# Patient Record
Sex: Female | Born: 1950 | Race: White | Hispanic: No | Smoking: Former smoker
Health system: Southern US, Community
[De-identification: ages and names within clinical notes are randomized; demographics above are authoritative.]

## PROBLEM LIST (undated history)

## (undated) DIAGNOSIS — E7800 Pure hypercholesterolemia, unspecified: Secondary | ICD-10-CM

## (undated) DIAGNOSIS — N903 Dysplasia of vulva, unspecified: Secondary | ICD-10-CM

## (undated) DIAGNOSIS — N3281 Overactive bladder: Secondary | ICD-10-CM

## (undated) DIAGNOSIS — D369 Benign neoplasm, unspecified site: Secondary | ICD-10-CM

## (undated) DIAGNOSIS — R0602 Shortness of breath: Secondary | ICD-10-CM

## (undated) DIAGNOSIS — R011 Cardiac murmur, unspecified: Secondary | ICD-10-CM

## (undated) DIAGNOSIS — M1990 Unspecified osteoarthritis, unspecified site: Secondary | ICD-10-CM

## (undated) DIAGNOSIS — K219 Gastro-esophageal reflux disease without esophagitis: Secondary | ICD-10-CM

## (undated) DIAGNOSIS — R112 Nausea with vomiting, unspecified: Secondary | ICD-10-CM

## (undated) DIAGNOSIS — Z780 Asymptomatic menopausal state: Secondary | ICD-10-CM

## (undated) DIAGNOSIS — G4730 Sleep apnea, unspecified: Secondary | ICD-10-CM

## (undated) DIAGNOSIS — F329 Major depressive disorder, single episode, unspecified: Secondary | ICD-10-CM

## (undated) DIAGNOSIS — K760 Fatty (change of) liver, not elsewhere classified: Secondary | ICD-10-CM

## (undated) DIAGNOSIS — E039 Hypothyroidism, unspecified: Secondary | ICD-10-CM

## (undated) DIAGNOSIS — K5792 Diverticulitis of intestine, part unspecified, without perforation or abscess without bleeding: Secondary | ICD-10-CM

## (undated) DIAGNOSIS — Z9889 Other specified postprocedural states: Secondary | ICD-10-CM

## (undated) DIAGNOSIS — E0500 Thyrotoxicosis with diffuse goiter without thyrotoxic crisis or storm: Secondary | ICD-10-CM

## (undated) DIAGNOSIS — F32A Depression, unspecified: Secondary | ICD-10-CM

## (undated) DIAGNOSIS — J449 Chronic obstructive pulmonary disease, unspecified: Secondary | ICD-10-CM

## (undated) DIAGNOSIS — D1803 Hemangioma of intra-abdominal structures: Secondary | ICD-10-CM

## (undated) DIAGNOSIS — G5600 Carpal tunnel syndrome, unspecified upper limb: Secondary | ICD-10-CM

## (undated) HISTORY — DX: Fatty (change of) liver, not elsewhere classified: K76.0

## (undated) HISTORY — DX: Major depressive disorder, single episode, unspecified: F32.9

## (undated) HISTORY — DX: Overactive bladder: N32.81

## (undated) HISTORY — DX: Gastro-esophageal reflux disease without esophagitis: K21.9

## (undated) HISTORY — DX: Hemangioma of intra-abdominal structures: D18.03

## (undated) HISTORY — DX: Carpal tunnel syndrome, unspecified upper limb: G56.00

## (undated) HISTORY — PX: COLONOSCOPY W/ BIOPSIES AND POLYPECTOMY: SHX1376

## (undated) HISTORY — DX: Dysplasia of vulva, unspecified: N90.3

## (undated) HISTORY — DX: Benign neoplasm, unspecified site: D36.9

## (undated) HISTORY — DX: Hypothyroidism, unspecified: E03.9

## (undated) HISTORY — PX: TUBAL LIGATION: SHX77

## (undated) HISTORY — DX: Pure hypercholesterolemia, unspecified: E78.00

## (undated) HISTORY — DX: Depression, unspecified: F32.A

## (undated) HISTORY — DX: Thyrotoxicosis with diffuse goiter without thyrotoxic crisis or storm: E05.00

## (undated) HISTORY — DX: Asymptomatic menopausal state: Z78.0

## (undated) HISTORY — PX: CARPAL TUNNEL RELEASE: SHX101

---

## 1979-08-25 HISTORY — PX: BREAST REDUCTION SURGERY: SHX8

## 1983-08-25 HISTORY — PX: TONSILLECTOMY: SUR1361

## 1996-08-24 HISTORY — PX: VESICOVAGINAL FISTULA CLOSURE W/ TAH: SUR271

## 1996-08-24 HISTORY — PX: ABDOMINAL HYSTERECTOMY: SHX81

## 1999-01-27 ENCOUNTER — Ambulatory Visit (HOSPITAL_COMMUNITY): Admission: RE | Admit: 1999-01-27 | Discharge: 1999-01-27 | Payer: Self-pay | Admitting: Family Medicine

## 1999-01-28 ENCOUNTER — Encounter: Payer: Self-pay | Admitting: Family Medicine

## 2000-09-27 ENCOUNTER — Other Ambulatory Visit: Admission: RE | Admit: 2000-09-27 | Discharge: 2000-09-27 | Payer: Self-pay | Admitting: Obstetrics and Gynecology

## 2003-03-04 ENCOUNTER — Encounter: Payer: Self-pay | Admitting: Emergency Medicine

## 2003-03-04 ENCOUNTER — Emergency Department (HOSPITAL_COMMUNITY): Admission: EM | Admit: 2003-03-04 | Discharge: 2003-03-04 | Payer: Self-pay | Admitting: Emergency Medicine

## 2004-01-24 ENCOUNTER — Other Ambulatory Visit: Admission: RE | Admit: 2004-01-24 | Discharge: 2004-01-24 | Payer: Self-pay | Admitting: Obstetrics and Gynecology

## 2004-04-14 ENCOUNTER — Encounter: Admission: RE | Admit: 2004-04-14 | Discharge: 2004-04-14 | Payer: Self-pay | Admitting: Obstetrics and Gynecology

## 2004-08-26 ENCOUNTER — Encounter: Admission: RE | Admit: 2004-08-26 | Discharge: 2004-08-26 | Payer: Self-pay | Admitting: Internal Medicine

## 2004-12-08 ENCOUNTER — Encounter: Admission: RE | Admit: 2004-12-08 | Discharge: 2004-12-08 | Payer: Self-pay | Admitting: *Deleted

## 2004-12-12 ENCOUNTER — Encounter (INDEPENDENT_AMBULATORY_CARE_PROVIDER_SITE_OTHER): Payer: Self-pay | Admitting: Specialist

## 2004-12-12 ENCOUNTER — Ambulatory Visit (HOSPITAL_COMMUNITY): Admission: RE | Admit: 2004-12-12 | Discharge: 2004-12-12 | Payer: Self-pay | Admitting: *Deleted

## 2004-12-12 ENCOUNTER — Ambulatory Visit (HOSPITAL_BASED_OUTPATIENT_CLINIC_OR_DEPARTMENT_OTHER): Admission: RE | Admit: 2004-12-12 | Discharge: 2004-12-12 | Payer: Self-pay | Admitting: *Deleted

## 2005-04-15 ENCOUNTER — Encounter (INDEPENDENT_AMBULATORY_CARE_PROVIDER_SITE_OTHER): Payer: Self-pay | Admitting: Specialist

## 2005-04-15 ENCOUNTER — Ambulatory Visit (HOSPITAL_COMMUNITY): Admission: RE | Admit: 2005-04-15 | Discharge: 2005-04-15 | Payer: Self-pay | Admitting: Gastroenterology

## 2005-05-20 ENCOUNTER — Encounter: Admission: RE | Admit: 2005-05-20 | Discharge: 2005-05-20 | Payer: Self-pay | Admitting: Obstetrics and Gynecology

## 2005-08-03 ENCOUNTER — Encounter: Admission: RE | Admit: 2005-08-03 | Discharge: 2005-08-03 | Payer: Self-pay | Admitting: Internal Medicine

## 2006-03-06 ENCOUNTER — Emergency Department (HOSPITAL_COMMUNITY): Admission: EM | Admit: 2006-03-06 | Discharge: 2006-03-06 | Payer: Self-pay | Admitting: Emergency Medicine

## 2006-09-22 IMAGING — US US ABDOMEN COMPLETE
1 series · 14 of 25 positions shown · non-contrast
Comparison: 08/26/04.

CLINICAL DATA: Elevated LFTs.
 ABDOMEN ULTRASOUND:
TECHNIQUE: Complete abdominal ultrasound examination was performed including evaluation of the liver, gallbladder, bile ducts, pancreas, kidneys, spleen, IVC, and abdominal aorta.

[Series 1: unknown · 14 of 63 slices shown]
[im 1/63]
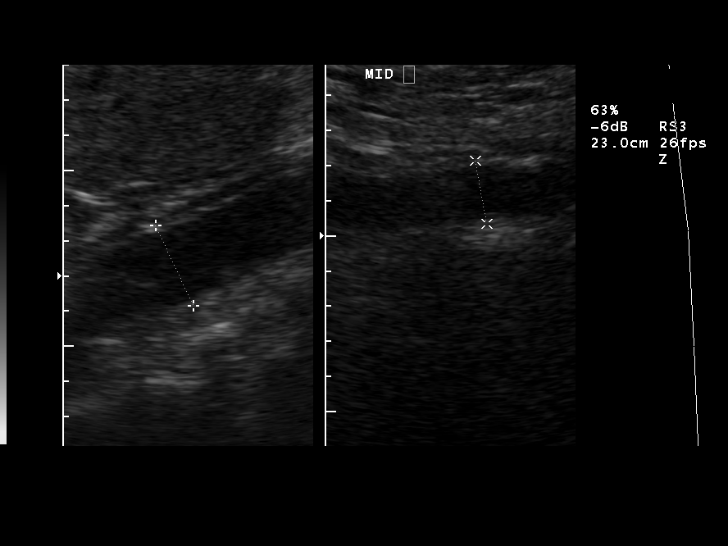
[im 6/63]
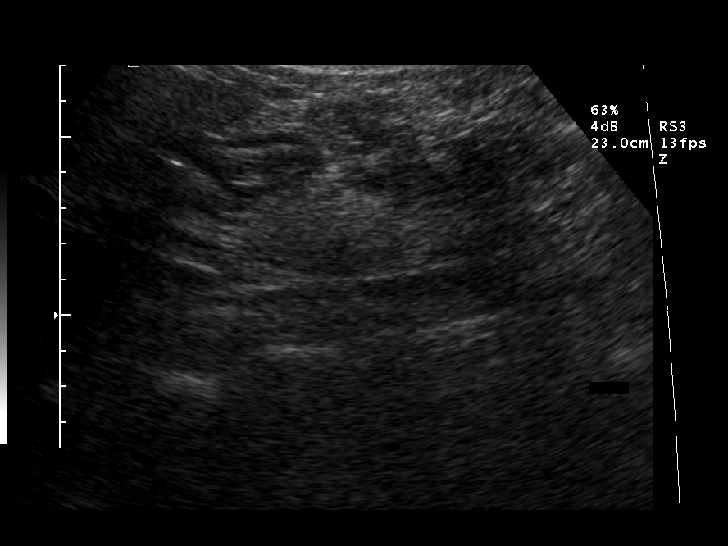
[im 11/63]
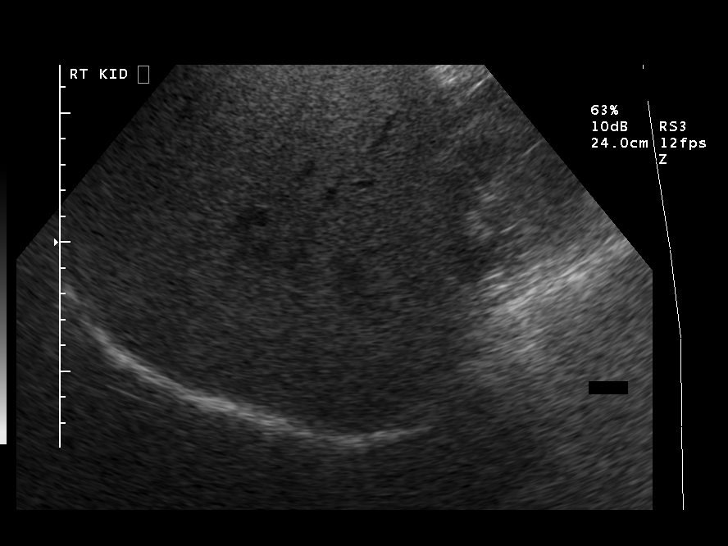
[im 16/63]
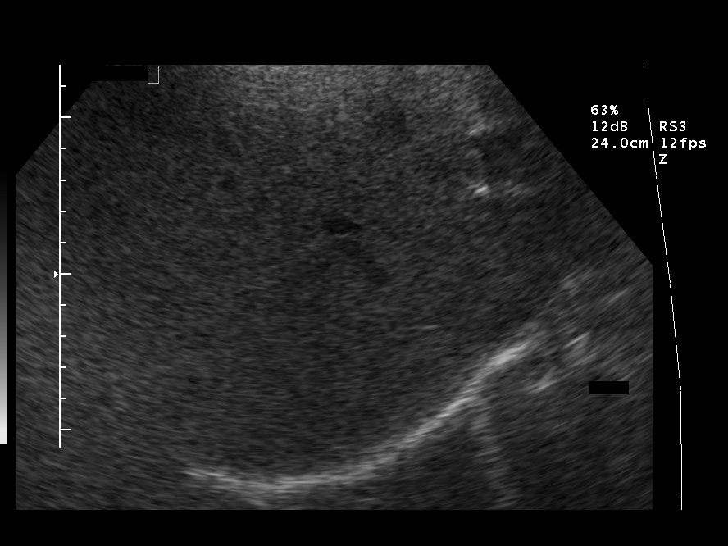
[im 21/63]
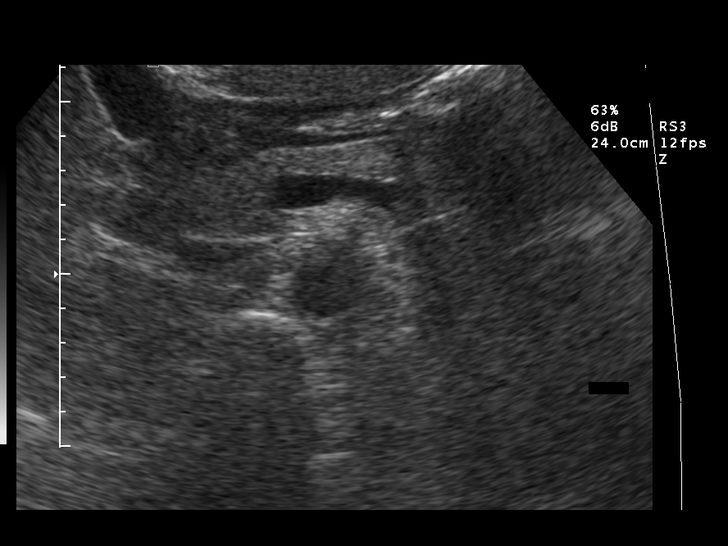
[im 24/63]
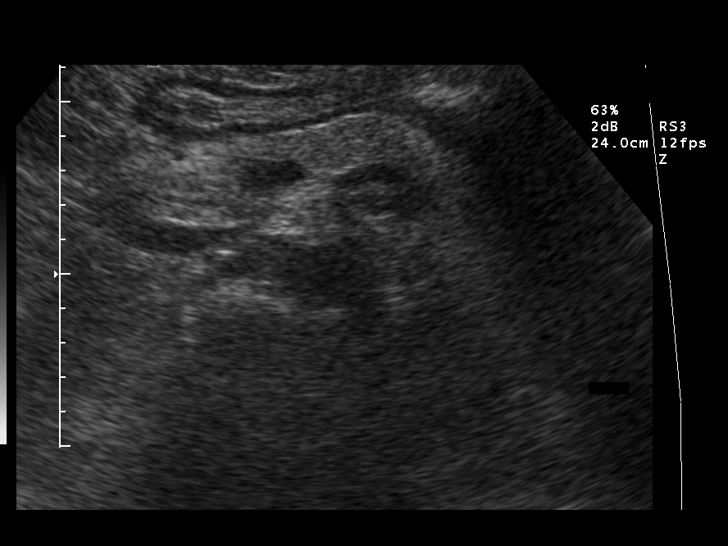
[im 29/63]
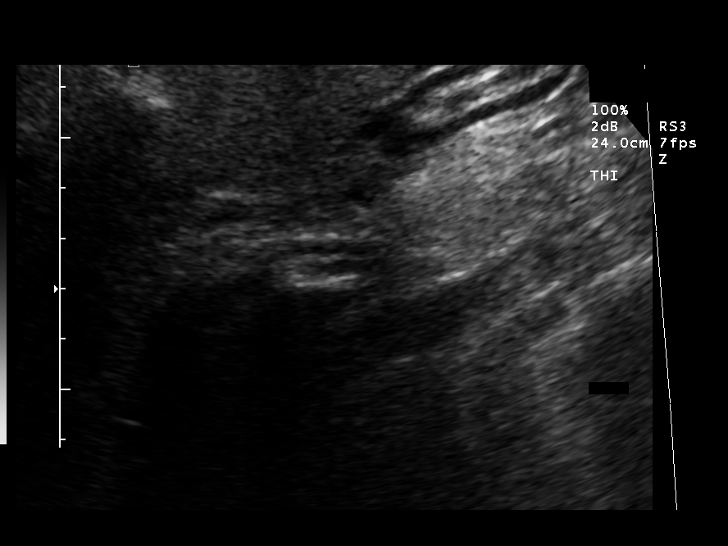
[im 34/63]
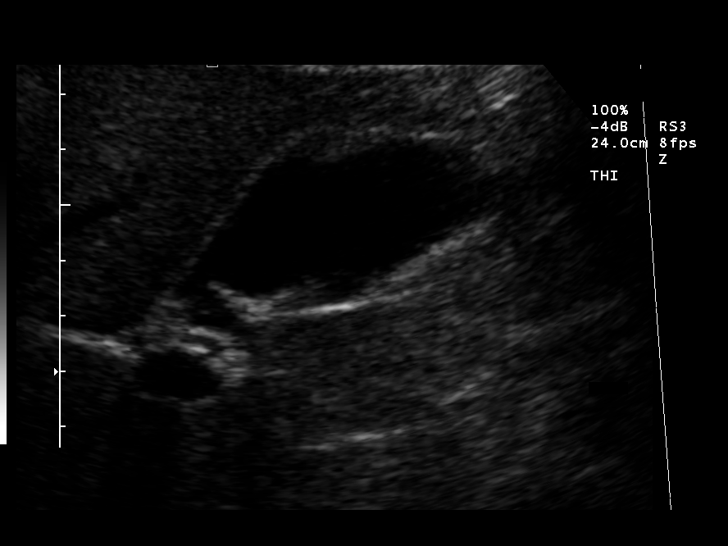
[im 39/63]
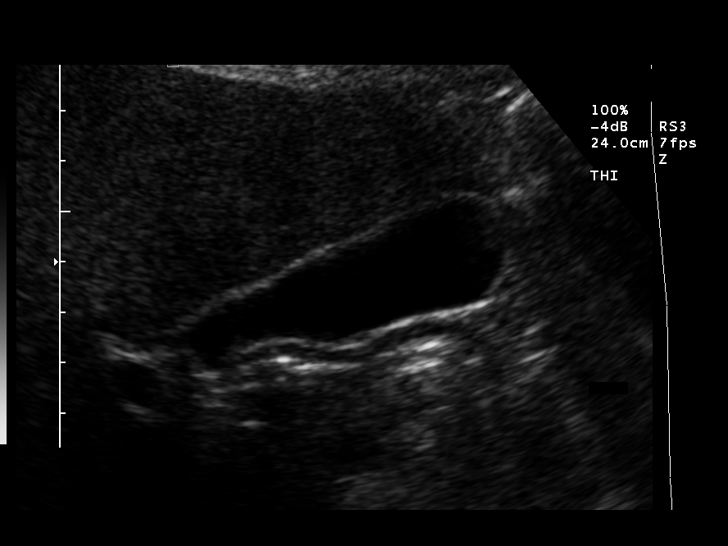
[im 42/63]
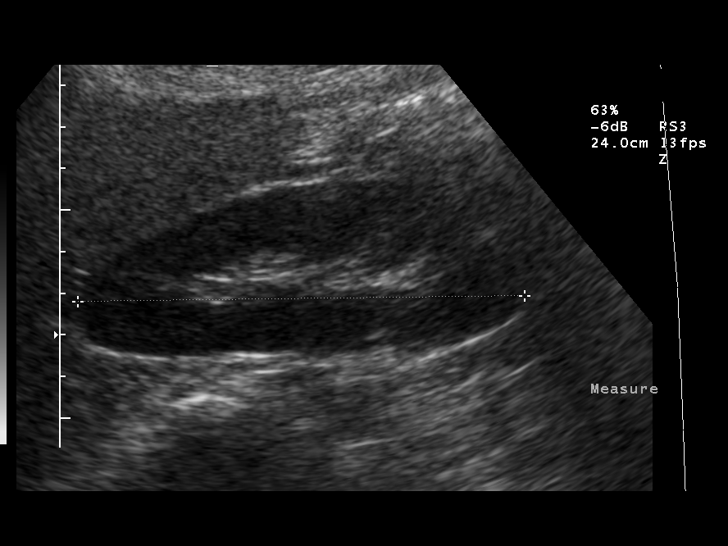
[im 47/63]
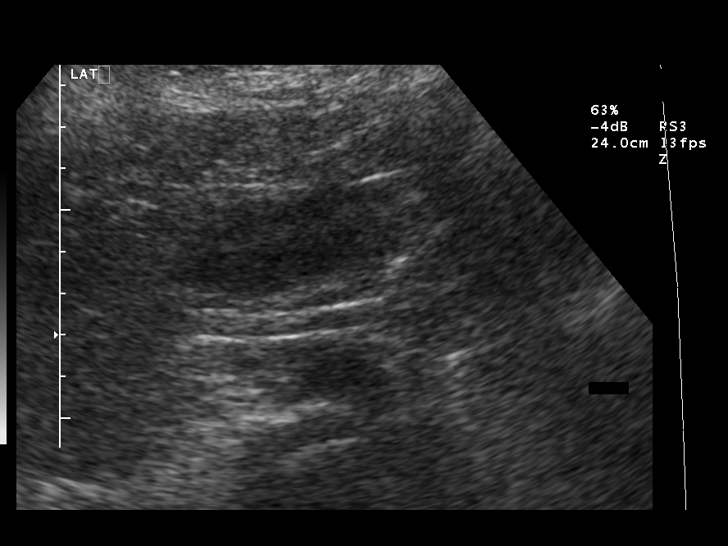
[im 52/63]
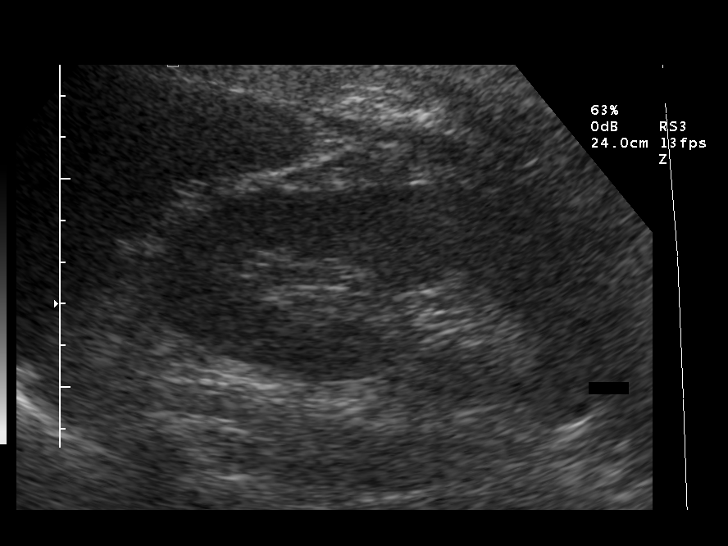
[im 57/63]
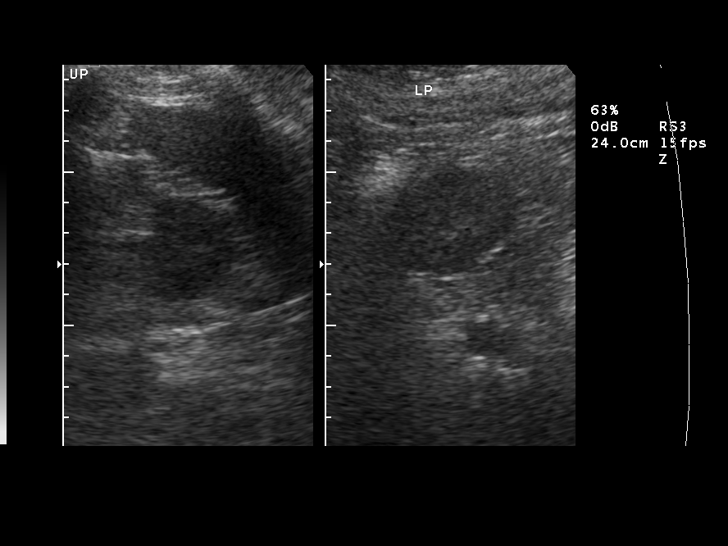
[im 63/63]
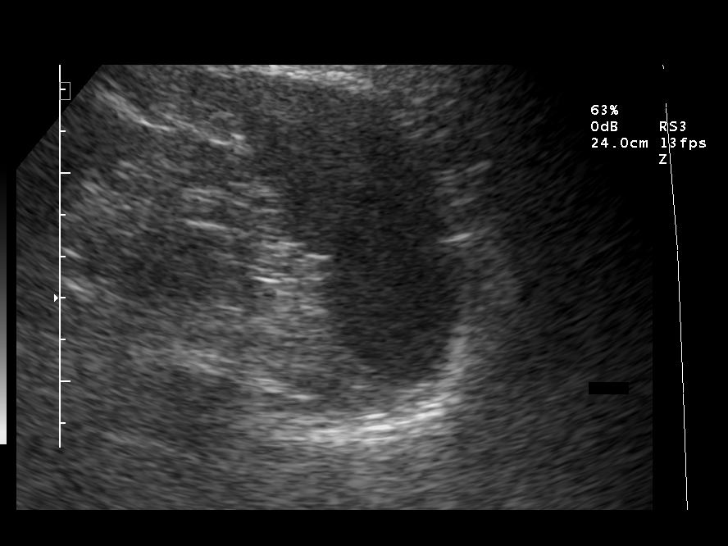

[14 of 25 positions shown; findings below may reference images not displayed]

On the prior study, small gallbladder polyps were questioned.  However, no gallbladder polyps are evident, and no gallstones are seen on the current exam.  The liver is echogenic consistent with fatty infiltration. The common bile duct is normal measuring between 2.6 and 3.4 mm in diameter.  Assessment of the IVC and pancreas is limited by bowel gas with the tail of the pancreas obscured.  The spleen is normal in size.  No hydronephrosis is noted.  Both kidneys measure 11.0 cm sagittally.  The abdominal aorta is normal in caliber.
IMPRESSION: 1.  No gallstones.  No definite gallbladder polyps are noted on the current study. 
 2.  Fatty infiltration of the liver.
 3.  Tail of pancreas obscured by bowel gas.

## 2007-02-07 ENCOUNTER — Encounter: Admission: RE | Admit: 2007-02-07 | Discharge: 2007-02-07 | Payer: Self-pay | Admitting: Gastroenterology

## 2007-08-25 HISTORY — PX: OTHER SURGICAL HISTORY: SHX169

## 2009-01-03 ENCOUNTER — Encounter: Admission: RE | Admit: 2009-01-03 | Discharge: 2009-01-03 | Payer: Self-pay | Admitting: Gastroenterology

## 2009-08-06 ENCOUNTER — Encounter: Admission: RE | Admit: 2009-08-06 | Discharge: 2009-08-06 | Payer: Self-pay | Admitting: Gastroenterology

## 2010-07-15 ENCOUNTER — Encounter: Admission: RE | Admit: 2010-07-15 | Discharge: 2010-07-15 | Payer: Self-pay | Admitting: Obstetrics and Gynecology

## 2010-07-22 ENCOUNTER — Encounter: Admission: RE | Admit: 2010-07-22 | Discharge: 2010-07-22 | Payer: Self-pay | Admitting: Obstetrics and Gynecology

## 2011-01-09 NOTE — Op Note (Signed)
Jennifer Barker, Jennifer Barker               ACCOUNT NO.:  [id number]   MEDICAL RECORD NO.:  [id number]          PATIENT TYPE:  AMB   LOCATION:  ENDO                         FACILITY:  [facility]   PHYSICIAN:  Danise Edge, M.D.   DATE OF BIRTH:  August 05, 1951   DATE OF PROCEDURE:  04/15/2005  DATE OF DISCHARGE:                                 OPERATIVE REPORT   PROCEDURE:  Screening colonoscopy with polypectomy.   REFERRING PHYSICIAN:  Dr. Ladell Pier, [facility].   INDICATIONS FOR PROCEDURE:  Ms. Cassandre Oleksy is a 60 year old female born  April 26, 1951. Ms. Vullo is scheduled to undergo her first screening  colonoscopy with polypectomy to prevent colon cancer. I discussed with her  the complications associated with colonoscopy and polypectomy including a 15  per 1000 risk of bleeding and 1 per 1000 risk of colon perforation requiring  surgical repair. Ms. Kerce has signed the operative permit.   ENDOSCOPIST:  Danise Edge, M.D.   PREMEDICATION:  Versed 7 mg, Demerol 70 mg.   DESCRIPTION OF PROCEDURE:  After obtaining informed consent, Ms. Capito was  placed in the left lateral decubitus position. I administered intravenous  Demerol and intravenous Versed to achieve conscious sedation for the  procedure. The patient's blood pressure, oxygen saturation and cardiac  rhythm were monitored throughout the procedure and documented in the medical  record.   Anal inspection and digital rectal exam were normal. The Olympus adjustable  pediatric colonoscope was introduced into the rectum and advanced to the  cecum. A normal appearing ileocecal valve and appendiceal orifice were  identified. Colonic preparation for the exam today was excellent.   RECTUM:  Normal. Retroflexed view of the distal rectum normal.  SIGMOID COLON AND DESCENDING COLON:  At 65 cm from the anal verge, a 3 mm  sessile polyp was removed with electrocautery snare and submitted for  pathologic  interpretation.  SPLENIC FLEXURE:  Normal.  TRANSVERSE COLON:  Normal.  HEPATIC FLEXURE:  Normal.  ASCENDING COLON:  Normal.  CECUM AND ILEOCECAL VALVE:  Normal.   ASSESSMENT:  A small polyp was removed from the descending colon at 65 cm  from the anal verge; otherwise normal screening proctocolonoscopy to the  cecum.   RECOMMENDATIONS:  Repeat colonoscopy in 5 years if descending colon polyp  returns neoplastic pathologically.           ______________________________  Danise Edge, M.D.     MJ/MEDQ  D:  04/15/2005  T:  04/15/2005  Job:  [id number]   cc:   Ladell Pier, M.D.  Fax: [phone]

## 2011-01-09 NOTE — Op Note (Signed)
NAMESHERONDA, PARRAN               ACCOUNT NO.:  [id number]   MEDICAL RECORD NO.:  [id number]          PATIENT TYPE:  AMB   LOCATION:  [facility]ES[facility]                         [facility]ACILITY:  [facility]   PHYSICIAN:  Pershing Cox, M.D.DATE OF BIRTH:  1951-04-20   DATE OF PROCEDURE:  DATE OF DISCHARGE:                                 OPERATIVE REPORT   Audio too short to transcribe (less than 5 seconds)      MAJ/MEDQ  D:  12/12/2004  T:  12/12/2004  Job:  [id number]

## 2011-01-09 NOTE — Op Note (Signed)
Jennifer Barker, SCHMUCK               ACCOUNT NO.:  [id number]   MEDICAL RECORD NO.:  [id number]          PATIENT TYPE:  AMB   LOCATION:  NESC                         FACILITY:  [facility]   PHYSICIAN:  Pershing Cox, M.D.DATE OF BIRTH:  [birth year]   DATE OF PROCEDURE:  12/12/2004  DATE OF DISCHARGE:                                 OPERATIVE REPORT   PREOPERATIVE DIAGNOSIS:  Vulvar intraepithelial neoplasia III.   POSTOPERATIVE DIAGNOSIS:  Vulvar intraepithelial neoplasia III.   PROCEDURE:  Colposcopy with wide local excision of the vulvar lesion.   SURGEON:  Pershing Cox, M.D.   ANESTHESIA:  General by LMA and local Marcaine plus epinephrine.   INDICATIONS FOR PROCEDURE:  This 60 year old female presented with an  abnormal Pap smear.  Culposcopic examination failed to show the lesion, and  she returned for a colposcopy.  I performed a vulvar colposcopy, which  easily showed an area of white epithelium.  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  The patient was consulted regarding this lesion  and the need for wide excision.  She was brought to the operating room today  for this procedure.   FINDINGS:  The area of white epithelium was localized to the right labia  minor/majora.  This was on the very distal end of the vulva and was easily  excised with a primary closure.  There were no other abnormal findings.   PROCEDURE:  [name] was brought to the operating room with an IV in  place.  She received 1 gm of Ancef in the holding area.  Supine on the OR  table, a laryngeal mask was placed after IV sedation had been administered.  She was placed into Allen stirrups, and culposcopic examination was  performed.  Using a marking pen, the acetowhite stained area was discerned,  and margins of 1 cm were clearly marked.  With the limits of the excision  planned, the patient was prepped with Hibiclens.  A red rubber catheter was  used to empty the bladder.   She was then draped for a sterile vaginal  procedure.  The right labia was lifted with a sponge, and 0.5% Marcaine with  epinephrine was instilled into the base using a total volume of 8 cc.  The  area which had been marked then was incised with a scalpel.  Bovie cautery  was used to come across the subcutaneous base.  The specimen was handed off  and placed into wet sponges to be carried later to pathology for margins.  The Bovie cautery was used to obtain hemostasis.  Vicryl 4-0 was then used  to close the subcutaneous tissue and with individual stitches.  Then a 4-0  Vicryl was used to close in a subcuticular manner.  There was no bleeding at  the end of the procedure.  The procedure was tolerated well.  The specimen  was wide excision of the right labia majora.  There were no intraoperative  complications.    MAJ/MEDQ  D:  12/12/2004  T:  12/12/2004  Job:  [id number]

## 2011-07-28 ENCOUNTER — Other Ambulatory Visit: Payer: Self-pay | Admitting: Gastroenterology

## 2012-05-31 ENCOUNTER — Other Ambulatory Visit (HOSPITAL_COMMUNITY): Payer: Self-pay | Admitting: Gastroenterology

## 2012-05-31 DIAGNOSIS — J45909 Unspecified asthma, uncomplicated: Secondary | ICD-10-CM

## 2012-06-02 ENCOUNTER — Encounter (HOSPITAL_COMMUNITY): Payer: Self-pay

## 2012-06-08 ENCOUNTER — Ambulatory Visit (HOSPITAL_COMMUNITY)
Admission: RE | Admit: 2012-06-08 | Discharge: 2012-06-08 | Disposition: A | Discharge status: Home / Self Care | Payer: BC Managed Care – PPO | Source: Ambulatory Visit | Attending: Gastroenterology | Admitting: Gastroenterology

## 2012-06-08 DIAGNOSIS — J45909 Unspecified asthma, uncomplicated: Secondary | ICD-10-CM | POA: Insufficient documentation

## 2012-06-08 LAB — PULMONARY FUNCTION TEST

## 2012-06-08 MED ORDER — ALBUTEROL SULFATE (5 MG/ML) 0.5% IN NEBU
2.5000 mg | INHALATION_SOLUTION | Freq: Once | RESPIRATORY_TRACT | Status: AC
  Administered 2012-06-08: 2.5 mg via RESPIRATORY_TRACT

## 2012-08-18 ENCOUNTER — Encounter: Payer: Self-pay | Admitting: Internal Medicine

## 2012-08-19 ENCOUNTER — Encounter: Payer: Self-pay | Admitting: Internal Medicine

## 2012-08-19 ENCOUNTER — Ambulatory Visit (INDEPENDENT_AMBULATORY_CARE_PROVIDER_SITE_OTHER): Payer: BC Managed Care – PPO | Admitting: Internal Medicine

## 2012-08-19 VITALS — BP 122/80 | HR 93 | Ht 62.0 in | Wt 241.6 lb

## 2012-08-19 DIAGNOSIS — J449 Chronic obstructive pulmonary disease, unspecified: Secondary | ICD-10-CM

## 2012-08-19 DIAGNOSIS — J45909 Unspecified asthma, uncomplicated: Secondary | ICD-10-CM

## 2012-08-19 DIAGNOSIS — G4733 Obstructive sleep apnea (adult) (pediatric): Secondary | ICD-10-CM

## 2012-08-19 NOTE — Patient Instructions (Addendum)
Sample Dulera 100 inhaler    2 puffs then rinse mouth, twice daily     Try this instead of Advair  I will ask my office to contact Dr Annia Friendly for results of CXR for our files  I would suggest you talk to Dr Daphine Deutscher about getting a sleep study to look for sleep apnea, since you notice you are waking yourself up.  Order- schedule  Asthma with COPD

## 2012-08-19 NOTE — Progress Notes (Signed)
08/19/12- [name] former smoker referred by Dr. Laural Benes for questionable asthma.  Has SOB that can be with exertion or rest, wheezing, chest tightness, and nonprod cough - -- symptoms started several months ago.  Had flu like illness/ wheezing in spring of 2013 treated., treated with Advair and rescue inhaler. Now gradually worse wheezing and dyspnea. Gaining weight. No prior history of asthma, pneumonia or allergy. Overall easier dyspnea on exertion since spring 2013. Rescue inhaler has been some help, used about 4 times per week. Has been consistent with Advair twice daily. PFT had shown moderate obstructive airways disease with air trapping and response to bronchodilator in October 2013. Environment: House with no basement. Has cat. Mold in wall paper at work place after water leak.  Smoked for 33 years, 1-1/2 packs per day, stopping in 2003. Daughter with allergic asthma. Aware of heartburn and reflux symptoms. Shortness of breath at rest and with exertion. Nonproductive cough. Atypical chest pains. Sneezing and itching. She sleeps alone but is concerned that she wakes herself snoring, gets tired during the day, and might have sleep apnea.  PFT 06/08/12/ [facility]- FEV1 1.75/73%, FEV1/FVC 0.63, FEF 25-75% 1.10/48%. RV 154%, TLC 120%, DLCO 112%. Moderate obstruction with response to bronchodilator hyperinflation and air trapping  // on return- needs allergy assessment- mold??//  Prior to Admission medications   Medication Sig Start Date End Date Taking? Authorizing Provider  albuterol (PROVENTIL HFA;VENTOLIN HFA) 108 (90 BASE) MCG/ACT inhaler Inhale 2 puffs into the lungs every 4 (four) hours as needed.   Yes Historical Provider, MD  atorvastatin (LIPITOR) 20 MG tablet Take 20 mg by mouth daily.   Yes Historical Provider, MD  Cholecalciferol (VITAMIN D PO) Take 2 capsules by mouth daily.   Yes Historical Provider, MD  FLUoxetine (PROZAC) 10 MG capsule Take 10 mg by mouth daily.   Yes Historical  Provider, MD  Fluticasone-Salmeterol (ADVAIR DISKUS) 250-50 MCG/DOSE AEPB Inhale 1 puff into the lungs every 12 (twelve) hours.   Yes Historical Provider, MD  levothyroxine (SYNTHROID, LEVOTHROID) 75 MCG tablet Take 1 tablet by mouth daily.   Yes Historical Provider, MD  Multiple Vitamin (MULTIVITAMIN) tablet Take 1 tablet by mouth daily.   Yes Historical Provider, MD  omeprazole (PRILOSEC) 20 MG capsule Take 20 mg by mouth daily.   Yes Historical Provider, MD   Past Medical History  Diagnosis Date  . Fatty liver   . GERD (gastroesophageal reflux disease)   . Graves disease   . Hypothyroidism     post radioactive iodine to treat Graves' disease  . Depression   . Postmenopausal   . Carpal tunnel syndrome   . Overactive bladder   . Hypercholesteremia   . Vulvar dysplasia   . Adenomatous polyp   . Liver hemangioma    Past Surgical History  Procedure Date  . Vesicovaginal fistula closure w/ tah 1998  . Breast reduction surgery 1981  . Right shoulder surgery 2009   Family History  Problem Relation Age of Onset  . Heart attack Father   . Lung cancer Mother   . Asthma Daughter   . Allergies Daughter    History   Social History  . Marital Status: Divorced    Spouse Name: N/A    Number of Children: 1  . Years of Education: N/A   Occupational History  . Accountant    Social History Main Topics  . Smoking status: Former Smoker -- 1.5 packs/day for 33 years    Types: Cigarettes  Quit date: 08/24/2001  . Smokeless tobacco: Never Used  . Alcohol Use: Yes     Comment: wine a couple times a week  . Drug Use: No  . Sexually Active: Not on file   Other Topics Concern  . Not on file   Social History Narrative  . No narrative on file   ROS-see HPI Constitutional:   No-   weight loss, night sweats, fevers, chills, fatigue, lassitude. HEENT:   No-  headaches, difficulty swallowing, tooth/dental problems, sore throat,       + sneezing, itching, ear ache, nasal congestion,  post nasal drip,  CV:  No- anginal  chest pain, orthopnea, PND, swelling in lower extremities, anasarca,  dizziness, palpitations Resp: No-   shortness of breath with exertion or at rest.              No-   productive cough,  + non-productive cough,  No- coughing up of blood.              No-   change in color of mucus.  + wheezing.   Skin: No-   rash or lesions. GI:  No-   +Heartburn,+ indigestion, abdominal pain, nausea, vomiting, diarrhea,                 change in bowel habits, loss of appetite GU: No-   dysuria, change in color of urine, no urgency or frequency.  No- flank pain. MS:  No-   joint pain or swelling.  No- decreased range of motion.  No- back pain. Neuro-     nothing unusual Psych:  No- change in mood or affect. + depression or anxiety.  No memory loss.  OBJ- Physical Exam General- Alert, Oriented, Affect-appropriate, Distress- none acute, obese Skin- rash-none, lesions- none, excoriation- none Lymphadenopathy- none Head- atraumatic            Eyes- Gross vision intact, PERRLA, conjunctivae and secretions clear            Ears- Hearing, canals-normal            Nose- Clear, no-Septal dev, mucus, polyps, erosion, perforation             Throat- Mallampati II , mucosa clear , drainage- none, tonsils- atrophic Neck- flexible , trachea midline, no stridor , thyroid nl, carotid no bruit Chest - symmetrical excursion , unlabored           Heart/CV- RRR , no murmur , no gallop  , no rub, nl s1 s2                           - JVD- none , edema- none, stasis changes- none, varices- none           Lung- clear to P&A, wheeze- none, cough- none , dullness-none, rub- none           Chest wall-  Abd- tender-no, distended-no, bowel sounds-present, HSM- no Br/ Gen/ Rectal- Not done, not indicated Extrem- cyanosis- none, clubbing, none, atrophy- none, strength- nl Neuro- grossly intact to observation

## 2012-08-24 HISTORY — PX: KNEE ARTHROSCOPY: SUR90

## 2012-09-01 DIAGNOSIS — J449 Chronic obstructive pulmonary disease, unspecified: Secondary | ICD-10-CM | POA: Insufficient documentation

## 2012-09-01 DIAGNOSIS — G4733 Obstructive sleep apnea (adult) (pediatric): Secondary | ICD-10-CM | POA: Insufficient documentation

## 2012-09-01 NOTE — Assessment & Plan Note (Addendum)
Well-preserved diffusion capacity suggests this is primarily bronchitis rather than emphysema. Weight gain and deconditioning are aggravating factors. She had had a chest x-ray through [facility] and we will request that report for our records Plan-try sample Dulera 100 instead of Advair. Schedule 6 minute walk test. Encourage walking for endurance

## 2012-09-01 NOTE — Assessment & Plan Note (Signed)
She is concerned she might have sleep apnea because she wakes herself snoring. I told her Dr. Laural Benes might prefer that she get evaluated through [facility] so she will discuss with him.

## 2012-09-20 ENCOUNTER — Ambulatory Visit: Payer: BC Managed Care – PPO | Admitting: Internal Medicine

## 2012-09-20 ENCOUNTER — Ambulatory Visit: Payer: BC Managed Care – PPO

## 2012-10-13 ENCOUNTER — Ambulatory Visit: Payer: BC Managed Care – PPO

## 2012-10-13 ENCOUNTER — Ambulatory Visit: Payer: BC Managed Care – PPO | Admitting: Internal Medicine

## 2012-11-15 ENCOUNTER — Encounter: Payer: Self-pay | Admitting: Internal Medicine

## 2012-11-15 ENCOUNTER — Other Ambulatory Visit: Payer: BC Managed Care – PPO

## 2012-11-15 ENCOUNTER — Ambulatory Visit (INDEPENDENT_AMBULATORY_CARE_PROVIDER_SITE_OTHER): Payer: BC Managed Care – PPO | Admitting: Internal Medicine

## 2012-11-15 VITALS — BP 138/76 | HR 82 | Ht 62.0 in | Wt 239.2 lb

## 2012-11-15 DIAGNOSIS — J4489 Other specified chronic obstructive pulmonary disease: Secondary | ICD-10-CM

## 2012-11-15 DIAGNOSIS — J309 Allergic rhinitis, unspecified: Secondary | ICD-10-CM

## 2012-11-15 DIAGNOSIS — J302 Other seasonal allergic rhinitis: Secondary | ICD-10-CM

## 2012-11-15 DIAGNOSIS — G4733 Obstructive sleep apnea (adult) (pediatric): Secondary | ICD-10-CM

## 2012-11-15 DIAGNOSIS — R0600 Dyspnea, unspecified: Secondary | ICD-10-CM

## 2012-11-15 DIAGNOSIS — J449 Chronic obstructive pulmonary disease, unspecified: Secondary | ICD-10-CM

## 2012-11-15 DIAGNOSIS — R0989 Other specified symptoms and signs involving the circulatory and respiratory systems: Secondary | ICD-10-CM

## 2012-11-15 MED ORDER — MOMETASONE FURO-FORMOTEROL FUM 100-5 MCG/ACT IN AERO: INHALATION_SPRAY | RESPIRATORY_TRACT | Status: DC

## 2012-11-15 NOTE — Patient Instructions (Addendum)
Order- lab- Allergy profile   Allergic rhinitis             PCC Schedule NPSG split night protocol    Dx OSA    Script for [facility] 100 sent                     Suggest a non-sedating otc antihistamine to try for your allergic eyes and nose- claritin/ loratadine

## 2012-11-15 NOTE — Progress Notes (Signed)
08/19/12- 62 yo F former smoker referred by Dr. Laural Benes for questionable asthma.  Has SOB that can be with exertion or rest, wheezing, chest tightness, and nonprod cough - -- symptoms started several months ago.  Had flu like illness/ wheezing in spring of 2013 treated., treated with Advair and rescue inhaler. Now gradually worse wheezing and dyspnea. Gaining weight. No prior history of asthma, pneumonia or allergy. Overall easier dyspnea on exertion since spring 2013. Rescue inhaler has been some help, used about 4 times per week. Has been consistent with Advair twice daily. PFT had shown moderate obstructive airways disease with air trapping and response to bronchodilator in October 2013. Environment: House with no basement. Has cat. Mold in wall paper at work place after water leak.  Smoked for 33 years, 1-1/2 packs per day, stopping in 2003. Daughter with allergic asthma. Aware of heartburn and reflux symptoms. Shortness of breath at rest and with exertion. Nonproductive cough. Atypical chest pains. Sneezing and itching. She sleeps alone but is concerned that she wakes herself snoring, gets tired during the day, and might have sleep apnea.  PFT 06/08/12/ [facility]- FEV1 1.75/73%, FEV1/FVC 0.63, FEF 25-75% 1.10/48%. RV 154%, TLC 120%, DLCO 112%. Moderate obstruction with response to bronchodilator hyperinflation and air trapping  // on return- needs allergy assessment- mold??//  11/15/12- 62 yo F former smoker referred by Dr. Laural Benes for questionable asthma.  Has SOB that can be with exertion or rest, wheezing, chest tightness, and nonprod cough - -- symptoms started several months ago.  FOLLOWS FOR: review with patient; breathing is about the same as previous visit; Pt had high BP after test and c/o SOB as well. Eyes and nose water now with spring season. Like sample Dulera 100. Little change in shortness of breath, wheeze or nonproductive cough. No chest pain or palpitation. She has decided to go  forward with  a sleep study after her dentist also commented that sleep apnea seems likely. - 11/15/12-96%, 95%, 97%, 447 m. BP at baseline 140/72 reached maximum 200/92 by in the past and was 180/86 after 2 minutes rest.normal oxygenation with exercise, but hypertension.  ROS-see HPI Constitutional:   No-   weight loss, night sweats, fevers, chills, fatigue, lassitude. HEENT:   No-  headaches, difficulty swallowing, tooth/dental problems, sore throat,       + sneezing, itching, ear ache, nasal congestion, post nasal drip,  CV:  No- anginal  chest pain, orthopnea, PND, swelling in lower extremities, anasarca,  dizziness, palpitations Resp: No-   shortness of breath with exertion or at rest.              No-   productive cough,  + non-productive cough,  No- coughing up of blood.              No-   change in color of mucus.  + wheezing.   Skin: No-   rash or lesions. GI:  No-   +Heartburn,+ indigestion, abdominal pain, nausea, vomiting, diarrhea,                 change in bowel habits, loss of appetite GU: No-   dysuria, change in color of urine, no urgency or frequency.  No- flank pain. MS:  No-   joint pain or swelling.  No- decreased range of motion.  No- back pain. Neuro-     nothing unusual Psych:  No- change in mood or affect. + depression or anxiety.  No memory loss.  OBJ- Physical Exam  General- Alert, Oriented, Affect-appropriate, Distress- none acute, obese Skin- rash-none, lesions- none, excoriation- none Lymphadenopathy- none Head- atraumatic            Eyes- Gross vision intact, PERRLA, conjunctivae and secretions clear            Ears- Hearing, canals-normal            Nose- Clear, no-Septal dev, mucus, polyps, erosion, perforation             Throat- Mallampati II , mucosa clear , drainage- none, tonsils- atrophic Neck- flexible , trachea midline, no stridor , thyroid nl, carotid no bruit Chest - symmetrical excursion , unlabored           Heart/CV- RRR , no murmur , no  gallop  , no rub, nl s1 s2                           - JVD- none , edema- none, stasis changes- none, varices- none           Lung- clear to P&A, wheeze- none, cough- none , dullness-none, rub- none           Chest wall-  Abd- tender-no, distended-no, bowel sounds-present, HSM- no Br/ Gen/ Rectal- Not done, not indicated Extrem- cyanosis- none, clubbing, none, atrophy- none, strength- nl Neuro- grossly intact to observation

## 2012-11-16 LAB — ALLERGY FULL PROFILE
Allergen, D pternoyssinus,d7: <0.1 kU/L
Allergen,Goose feathers, e70: <0.1 kU/L
Alternaria Alternata: <0.1 kU/L
Aspergillus fumigatus, m3: <0.1 kU/L
Bermuda Grass: <0.1 kU/L
Candida Albicans: <0.1 kU/L
Cat Dander: <0.1 kU/L
Common Ragweed: <0.1 kU/L
D. farinae: <0.1 kU/L
Elm IgE: <0.1 kU/L
House Dust Hollister: <0.1 kU/L
IgE (Immunoglobulin E), Serum: 61.7 IU/mL (ref 0.0–180.0)
Lamb's Quarters: <0.1 kU/L
Oak: <0.1 kU/L
Plantain: <0.1 kU/L
Stemphylium Botryosum: <0.1 kU/L
Timothy Grass: <0.1 kU/L

## 2012-11-18 NOTE — Progress Notes (Signed)
Quick Note:  LMTCB ______ 

## 2012-11-20 DIAGNOSIS — J302 Other seasonal allergic rhinitis: Secondary | ICD-10-CM | POA: Insufficient documentation

## 2012-11-20 NOTE — Assessment & Plan Note (Addendum)
Moderate asthma with COPD. Good oxygenation with exercise. Exercise induced hypertension indicates need to consider cardiovascular limitation Plan-Dulera 100

## 2012-11-20 NOTE — Assessment & Plan Note (Signed)
Plan-schedule sleep study as discussed

## 2012-11-20 NOTE — Progress Notes (Signed)
Documentation for 6 minute walk test 

## 2012-11-20 NOTE — Assessment & Plan Note (Signed)
Plan-OTC antihistamines as discussed

## 2012-11-21 ENCOUNTER — Telehealth: Payer: Self-pay | Admitting: Internal Medicine

## 2012-11-21 NOTE — Telephone Encounter (Signed)
Notes Recorded by Ronny Bacon, CMA on 11/18/2012 at 12:20 PM LMTCB ------  Notes Recorded by Waymon Budge, MD on 11/17/2012 at 8:33 AM Allergy antibody levels are not very high by this test. We can discuss at next ov.  Pt aware. Carron Curie, CMA

## 2012-11-24 ENCOUNTER — Ambulatory Visit (HOSPITAL_BASED_OUTPATIENT_CLINIC_OR_DEPARTMENT_OTHER): Payer: BC Managed Care – PPO | Attending: Internal Medicine

## 2012-11-24 VITALS — Ht 61.0 in | Wt 240.0 lb

## 2012-11-24 DIAGNOSIS — G4733 Obstructive sleep apnea (adult) (pediatric): Secondary | ICD-10-CM | POA: Insufficient documentation

## 2012-11-24 DIAGNOSIS — G4761 Periodic limb movement disorder: Secondary | ICD-10-CM | POA: Insufficient documentation

## 2012-11-24 DIAGNOSIS — R351 Nocturia: Secondary | ICD-10-CM | POA: Insufficient documentation

## 2012-11-26 DIAGNOSIS — R0609 Other forms of dyspnea: Secondary | ICD-10-CM

## 2012-11-26 DIAGNOSIS — R0989 Other specified symptoms and signs involving the circulatory and respiratory systems: Secondary | ICD-10-CM

## 2012-11-26 DIAGNOSIS — G4733 Obstructive sleep apnea (adult) (pediatric): Secondary | ICD-10-CM

## 2012-11-26 DIAGNOSIS — R351 Nocturia: Secondary | ICD-10-CM

## 2012-11-26 DIAGNOSIS — G4761 Periodic limb movement disorder: Secondary | ICD-10-CM

## 2012-11-26 NOTE — Procedures (Signed)
[name], Jennifer Barker               ACCOUNT NO.:  [id number]  MEDICAL RECORD NO.:  [id number]          PATIENT TYPE:  OUT  LOCATION:  SLEEP CENTER                 FACILITY[facility]  [facility]  PHYSICIAN:  Zetta Stoneman D. Maple Hudson, MD, FCCP, FACPDATE OF BIRTH:  04/28/1951  DATE OF STUDY:  11/24/2012                           NOCTURNAL POLYSOMNOGRAM  REFERRING PHYSICIAN:  Chibueze Beasley D. Karthikeya Funke, MD, FCCP, FACP  INDICATION FOR STUDY:  Hypersomnia with sleep apnea.  EPWORTH SLEEPINESS SCORE:  11/24.  BMI 45.3, weight 240 pounds.  Height 61 inches.  Neck 15 inches.  MEDICATIONS:  Home medications are charted and reviewed.  SLEEP ARCHITECTURE:  Total sleep time 337.5 minutes with sleep efficiency 80.6%.  Stage I was 7.3%, stage II 73.3%, stage III absent, REM 19.4% of total sleep time.  Sleep latency 8 minutes, REM latency 215.5 minutes, awake after sleep onset 73 minutes.  Arousal index 6.6. Bedtime medication:  None.  RESPIRATORY DATA:  Apnea-hypopnea index (AHI) 11.4 per hour.  A total of 64 events was scored including 13 obstructive apneas and 51 hypopneas. Events were seen in all sleep positions.  REM AHI 42.1 per hour.  There were not enough early events to permit split protocol CPAP titration.  OXYGEN DATA:  Moderate snoring with oxygen desaturation to a nadir of 77% on room air.  Mean oxygen saturation through the study was 93.7% on room air.  CARDIAC DATA:  Sinus rhythm with PVCs.  MOVEMENT/PARASOMNIA:  A total of 113 limb jerks were counted, of which 3 were associated with arousals or awakening for periodic limb movement with arousal index of 0.5 per hour.  Bathroom x3.  IMPRESSION/RECOMMENDATION: 1. Mild obstructive sleep apnea/hypopnea syndrome, AHI 11.4 per hour     with events in all positions.  REM AHI 42.1 per hour.  Moderate     snoring with oxygen desaturation to a nadir of 77% on room air with     a mean saturation through the study of 93.7% on room air. 2. There were insufficient  early events to allow CPAP titration.     Consider return for dedicated CPAP titration study or evaluate for     alternative management as appropriate. 3. Mild periodic limb movement.  A total of 113 limb jerks were     counted, of which 3 were associated with     arousal for awakening for periodic limb movement with arousal index     of 0.5 per hour. 4. Nocturia x3 which may represent an additional cause of sleep     disturbance in the home environment.     Graceson Nichelson D. Maple Hudson, MD, [facility], FACP Diplomate, American Board of Sleep Medicine    CDY/MEDQ  D:  11/26/2012 15:36:47  T:  11/26/2012 23:34:24  Job:  [id number]

## 2012-12-28 ENCOUNTER — Ambulatory Visit (INDEPENDENT_AMBULATORY_CARE_PROVIDER_SITE_OTHER): Payer: BC Managed Care – PPO | Admitting: Internal Medicine

## 2012-12-28 ENCOUNTER — Encounter: Payer: Self-pay | Admitting: Internal Medicine

## 2012-12-28 VITALS — BP 142/82 | HR 71 | Ht 62.0 in | Wt 237.4 lb

## 2012-12-28 DIAGNOSIS — J309 Allergic rhinitis, unspecified: Secondary | ICD-10-CM

## 2012-12-28 DIAGNOSIS — J302 Other seasonal allergic rhinitis: Secondary | ICD-10-CM

## 2012-12-28 DIAGNOSIS — G4733 Obstructive sleep apnea (adult) (pediatric): Secondary | ICD-10-CM

## 2012-12-28 DIAGNOSIS — J449 Chronic obstructive pulmonary disease, unspecified: Secondary | ICD-10-CM

## 2012-12-28 MED ORDER — FLUTICASONE-SALMETEROL 100-50 MCG/DOSE IN AEPB: 1.0000 | INHALATION_SPRAY | Freq: Two times a day (BID) | RESPIRATORY_TRACT | Status: DC

## 2012-12-28 NOTE — Progress Notes (Signed)
08/19/12- 62 yo F former smoker referred by Dr. Laural Benes for questionable asthma.  Has SOB that can be with exertion or rest, wheezing, chest tightness, and nonprod cough - -- symptoms started several months ago.  Had flu like illness/ wheezing in spring of 2013 treated., treated with Advair and rescue inhaler. Now gradually worse wheezing and dyspnea. Gaining weight. No prior history of asthma, pneumonia or allergy. Overall easier dyspnea on exertion since spring 2013. Rescue inhaler has been some help, used about 4 times per week. Has been consistent with Advair twice daily. PFT had shown moderate obstructive airways disease with air trapping and response to bronchodilator in October 2013. Environment: House with no basement. Has cat. Mold in wall paper at work place after water leak.  Smoked for 33 years, 1-1/2 packs per day, stopping in 2003. Daughter with allergic asthma. Aware of heartburn and reflux symptoms. Shortness of breath at rest and with exertion. Nonproductive cough. Atypical chest pains. Sneezing and itching. She sleeps alone but is concerned that she wakes herself snoring, gets tired during the day, and might have sleep apnea.  PFT 06/08/12/ [facility]- FEV1 1.75/73%, FEV1/FVC 0.63, FEF 25-75% 1.10/48%. RV 154%, TLC 120%, DLCO 112%. Moderate obstruction with response to bronchodilator hyperinflation and air trapping  // on return- needs allergy assessment- mold??//  11/15/12- 49 yo F former smoker referred by Dr. Laural Benes for questionable asthma.  Has SOB that can be with exertion or rest, wheezing, chest tightness, and nonprod cough - -- symptoms started several months ago.  FOLLOWS FOR: review with patient; breathing is about the same as previous visit; Pt had high BP after test and c/o SOB as well. Eyes and nose water now with spring season. Like sample Dulera 100. Little change in shortness of breath, wheeze or nonproductive cough. No chest pain or palpitation. She has decided to go  forward with  a sleep study after her dentist also commented that sleep apnea seems likely. - 11/15/12-96%, 95%, 97%, 447 m. BP at baseline 140/72 reached maximum 200/92 by in the past and was 180/86 after 2 minutes rest.normal oxygenation with exercise, but hypertension.  12/28/12- 73 yo F former smoker referred by Dr. Laural Benes for COPD/ asthma, Allergic rhinitis, OSA PCP Dr Danise Edge FOLLOWS FOR: SOB and wheezing at times-uses rescue inhaler for relief; review sleep study with patient. Breathing has been controlled. Blames pollen for increased need for rescue inhaler while continuing Dulera 100.Marland Kitchen She is curious about retrying Advair. NPSG 11/24/12- mild obstructive sleep apnea, AHI 11.4/ hr. Also noted nocturia x3 disturbed sleep.  ROS-see HPI Constitutional:   No-   weight loss, night sweats, fevers, chills, fatigue, lassitude. HEENT:   No-  headaches, difficulty swallowing, tooth/dental problems, sore throat,       + sneezing, itching, ear ache, nasal congestion, post nasal drip,  CV:  No- anginal  chest pain, orthopnea, PND, swelling in lower extremities, anasarca,  dizziness, palpitations Resp: No-   shortness of breath with exertion or at rest.              No-   productive cough,  + non-productive cough,  No- coughing up of blood.              No-   change in color of mucus.  + wheezing.   Skin: No-   rash or lesions. GI:  No-   +Heartburn,+ indigestion, abdominal pain, nausea, vomiting, GU:  MS:  No-   joint pain or swelling.   Neuro-  nothing unusual Psych:  No- change in mood or affect. + depression or anxiety.  No memory loss.  OBJ- Physical Exam General- Alert, Oriented, Affect-appropriate, Distress- none acute, obese Skin- rash-none, lesions- none, excoriation- none Lymphadenopathy- none Head- atraumatic            Eyes- Gross vision intact, PERRLA, conjunctivae and secretions clear            Ears- Hearing, canals-normal            Nose- +turbinate edema, no-Septal  dev, mucus, polyps, erosion, perforation             Throat- Mallampati III , mucosa clear , drainage- none, tonsils- atrophic Neck- flexible , trachea midline, no stridor , thyroid nl, carotid no bruit Chest - symmetrical excursion , unlabored           Heart/CV- RRR , no murmur , no gallop  , no rub, nl s1 s2                           - JVD- none , edema- none, stasis changes- none, varices- none           Lung- clear to P&A, wheeze- none, cough- none , dullness-none, rub- none           Chest wall-  Abd- Br/ Gen/ Rectal- Not done, not indicated Extrem- cyanosis- none, clubbing, none, atrophy- none, strength- nl Neuro- grossly intact to observation

## 2012-12-28 NOTE — Patient Instructions (Addendum)
[facility] 7 days 5-15 cwp for pressure recommendation, mask of choice, humidifier, supplies   Dx OSA  Sample Advair 100     1 puff then rinse mouth, twice daily. Compare this to [facility]

## 2013-01-07 ENCOUNTER — Encounter: Payer: Self-pay | Admitting: Internal Medicine

## 2013-01-07 NOTE — Assessment & Plan Note (Signed)
Seasonal exacerbation Plan-OTC antihistamines should be sufficient

## 2013-01-07 NOTE — Assessment & Plan Note (Addendum)
Blames pollen for some increased use of rescue inhaler recently. We will watch this spring pollen season ends Plan-retry Advair to compare with Dulera at her request

## 2013-01-07 NOTE — Assessment & Plan Note (Addendum)
Educated about sleep apnea, sleep hygiene, responsibility to drive safely, importance of weight management. Plan-start CPAP as discussed

## 2013-02-13 ENCOUNTER — Ambulatory Visit (INDEPENDENT_AMBULATORY_CARE_PROVIDER_SITE_OTHER): Payer: BC Managed Care – PPO | Admitting: Internal Medicine

## 2013-02-13 ENCOUNTER — Encounter: Payer: Self-pay | Admitting: Internal Medicine

## 2013-02-13 VITALS — BP 124/74 | HR 83 | Ht 61.25 in | Wt 239.0 lb

## 2013-02-13 DIAGNOSIS — J449 Chronic obstructive pulmonary disease, unspecified: Secondary | ICD-10-CM

## 2013-02-13 DIAGNOSIS — G4733 Obstructive sleep apnea (adult) (pediatric): Secondary | ICD-10-CM

## 2013-02-13 MED ORDER — FLUTICASONE FUROATE-VILANTEROL 100-25 MCG/INH IN AEPB: 1.0000 | INHALATION_SPRAY | Freq: Every day | RESPIRATORY_TRACT | Status: DC

## 2013-02-13 NOTE — Progress Notes (Signed)
08/19/12- 62 yo F former smoker referred by Dr. Laural Benes for questionable asthma.  Has SOB that can be with exertion or rest, wheezing, chest tightness, and nonprod cough - -- symptoms started several months ago.  Had flu like illness/ wheezing in spring of 2013 treated., treated with Advair and rescue inhaler. Now gradually worse wheezing and dyspnea. Gaining weight. No prior history of asthma, pneumonia or allergy. Overall easier dyspnea on exertion since spring 2013. Rescue inhaler has been some help, used about 4 times per week. Has been consistent with Advair twice daily. PFT had shown moderate obstructive airways disease with air trapping and response to bronchodilator in October 2013. Environment: House with no basement. Has cat. Mold in wall paper at work place after water leak.  Smoked for 33 years, 1-1/2 packs per day, stopping in 2003. Daughter with allergic asthma. Aware of heartburn and reflux symptoms. Shortness of breath at rest and with exertion. Nonproductive cough. Atypical chest pains. Sneezing and itching. She sleeps alone but is concerned that she wakes herself snoring, gets tired during the day, and might have sleep apnea.  PFT 06/08/12/ [facility]- FEV1 1.75/73%, FEV1/FVC 0.63, FEF 25-75% 1.10/48%. RV 154%, TLC 120%, DLCO 112%. Moderate obstruction with response to bronchodilator hyperinflation and air trapping  // on return- needs allergy assessment- mold??//  11/15/12- 84 yo F former smoker referred by Dr. Laural Benes for questionable asthma.  Has SOB that can be with exertion or rest, wheezing, chest tightness, and nonprod cough - -- symptoms started several months ago.  FOLLOWS FOR: review with patient; breathing is about the same as previous visit; Pt had high BP after test and c/o SOB as well. Eyes and nose water now with spring season. Like sample Dulera 100. Little change in shortness of breath, wheeze or nonproductive cough. No chest pain or palpitation. She has decided to go  forward with  a sleep study after her dentist also commented that sleep apnea seems likely. - 11/15/12-96%, 95%, 97%, 447 m. BP at baseline 140/72 reached maximum 200/92 by in the past and was 180/86 after 2 minutes rest.normal oxygenation with exercise, but hypertension.  12/28/12- [name] former smoker referred by Dr. Laural Benes for COPD/ asthma, Allergic rhinitis, OSA PCP Dr Danise Edge FOLLOWS FOR: SOB and wheezing at times-uses rescue inhaler for relief; review sleep study with patient. Breathing has been controlled. Blames pollen for increased need for rescue inhaler while continuing Dulera 100.Marland Kitchen She is curious about retrying Advair. NPSG 11/24/12- mild obstructive sleep apnea, AHI 11.4/ hr. Also noted nocturia x3 disturbed sleep.  02/13/13- 52 yo F former smoker referred by Dr. Laural Benes for COPD/ asthma, Allergic rhinitis, OSA PCP Dr Danise Edge FOLLOWS AVW:[id number] CPAP Auto/ Advanced every night anywhere from 4-8 hours(took chip from CPAP on Friday)  Stayed with Dulera, usually only once a day.   ROS-see HPI Constitutional:   No-   weight loss, night sweats, fevers, chills, fatigue, lassitude. HEENT:   No-  headaches, difficulty swallowing, tooth/dental problems, sore throat,       + sneezing, itching, ear ache, nasal congestion, post nasal drip,  CV:  No- anginal  chest pain, orthopnea, PND, swelling in lower extremities, anasarca,  dizziness, palpitations Resp: No-   shortness of breath with exertion or at rest.              No-   productive cough,   non-productive cough,  No- coughing up of blood.  No-   change in color of mucus.  + wheezing.   Skin: No-   rash or lesions. GI:  No-   +Heartburn,+ indigestion, abdominal pain, nausea, vomiting, GU:  MS:  No-   joint pain or swelling.   Neuro-     nothing unusual Psych:  No- change in mood or affect. + depression or anxiety.  No memory loss.  OBJ- Physical Exam General- Alert, Oriented, Affect-appropriate, Distress-  none acute, obese Skin- rash-none, lesions- none, excoriation- none Lymphadenopathy- none Head- atraumatic            Eyes- Gross vision intact, PERRLA, conjunctivae and secretions clear            Ears- Hearing, canals-normal            Nose- +turbinate edema, no-Septal dev, mucus, polyps, erosion, perforation             Throat- Mallampati III , mucosa clear , drainage- none, tonsils- atrophic Neck- flexible , trachea midline, no stridor , thyroid nl, carotid no bruit Chest - symmetrical excursion , unlabored           Heart/CV- RRR , no murmur , no gallop  , no rub, nl s1 s2                           - JVD- none , edema- none, stasis changes- none, varices- none           Lung- clear to P&A, wheeze- none, cough- none , dullness-none, rub- none           Chest wall-  Abd- Br/ Gen/ Rectal- Not done, not indicated Extrem- cyanosis- none, clubbing, none, atrophy- none, strength- nl Neuro- grossly intact to observation

## 2013-02-13 NOTE — Patient Instructions (Addendum)
We may have [facility] change your pressure settings on CPAP after I see the download. If you don't like the change, please let me know.  Sample Breo Ellipta  1 puff, then rinse mouth, just once daily. Compare this to [facility] and Advair

## 2013-02-15 ENCOUNTER — Telehealth: Payer: Self-pay | Admitting: Internal Medicine

## 2013-02-15 DIAGNOSIS — G4733 Obstructive sleep apnea (adult) (pediatric): Secondary | ICD-10-CM

## 2013-02-15 NOTE — Telephone Encounter (Signed)
lmomtcb on pt's named VM 

## 2013-02-16 NOTE — Telephone Encounter (Signed)
Spoke with pt and notified of recs per CDY She verbalized understanding  Order and staff msg to [facility]

## 2013-02-16 NOTE — Telephone Encounter (Signed)
LMTCB

## 2013-02-16 NOTE — Telephone Encounter (Signed)
Spoke with patient,  Patient would like to know if Dr. Maple Hudson has received download and if so what are the results. Patient stated she also has a copy and I have requested she go ahead and fax it to our office. Results received through fax and placed on Dr. Roxy Cedar cart. Please advise, thank you!

## 2013-02-16 NOTE — Telephone Encounter (Signed)
lmomtcb x1 

## 2013-02-16 NOTE — Telephone Encounter (Signed)
CPAP download from [facility] shows good compliance and control.  I recommend order DME / [facility]    Change CPAP to fixed 13      Dx OSA

## 2013-02-16 NOTE — Telephone Encounter (Signed)
Pt returned call. Jennifer Barker °

## 2013-02-26 NOTE — Assessment & Plan Note (Signed)
GoodStart with CPAP. We're awaiting download for trial of fixed pressure.

## 2013-02-26 NOTE — Assessment & Plan Note (Signed)
She doesn't remember to use Dulera twice daily so we are going to try sample Breo once daily, as discussed

## 2013-02-27 ENCOUNTER — Telehealth: Payer: Self-pay | Admitting: Internal Medicine

## 2013-02-27 DIAGNOSIS — G4733 Obstructive sleep apnea (adult) (pediatric): Secondary | ICD-10-CM

## 2013-02-27 NOTE — Telephone Encounter (Signed)
Ok to have DME [facility] reduce CPAP from 13 to 11 cwp    Dx OSA

## 2013-02-27 NOTE — Telephone Encounter (Signed)
Waymon Budge, MD at 02/16/2013 1:07 PM   Status: Signed            CPAP download from [facility] shows good compliance and control.  I recommend order DME / [facility] Change CPAP to fixed 13 Dx OSA     Pt states she has not been able to sleep past few days as CPAP pressure is too high and would like order sent to [facility] to have the pressure lowered. CY please advise. Thanks.

## 2013-02-27 NOTE — Telephone Encounter (Signed)
Pt aware that we were placing order to DME and that she will hear from them.

## 2013-02-28 ENCOUNTER — Telehealth: Payer: Self-pay | Admitting: Internal Medicine

## 2013-02-28 NOTE — Telephone Encounter (Signed)
Jennifer Barker and I have spoken to pt in ref to this, she has the order and in process of being set up. Patient says she will continue with [facility] .[name]

## 2013-02-28 NOTE — Telephone Encounter (Signed)
Per order placed 02/27/13, order was given to [facility].   Called, spoke with Melissa.  They have received order, and pt should be hearing from [facility] resp depart soon to get cpap pressure changed.   Pt aware.  Pt states she has "been through the ringer about a dozen times" when dealing with [facility] and "they are horrible" to deal with.  She would like to check into switching to a different DME.  PCCs, will you pls check into this for pt and call her with any options she may have?  Thank you.

## 2013-06-09 ENCOUNTER — Telehealth: Payer: Self-pay | Admitting: Internal Medicine

## 2013-06-09 NOTE — Telephone Encounter (Signed)
Spoke with patient-aware that we will have her download prior to [facility] appointment with CY; Spoke with [facility] and they are faxing results to front fax number to my attention.

## 2013-06-12 ENCOUNTER — Encounter: Payer: Self-pay | Admitting: Internal Medicine

## 2013-06-12 ENCOUNTER — Other Ambulatory Visit: Payer: BC Managed Care – PPO

## 2013-06-12 ENCOUNTER — Ambulatory Visit (INDEPENDENT_AMBULATORY_CARE_PROVIDER_SITE_OTHER): Payer: BC Managed Care – PPO | Admitting: Internal Medicine

## 2013-06-12 VITALS — BP 120/76 | HR 68 | Ht 61.25 in | Wt 215.8 lb

## 2013-06-12 DIAGNOSIS — Z23 Encounter for immunization: Secondary | ICD-10-CM

## 2013-06-12 DIAGNOSIS — J302 Other seasonal allergic rhinitis: Secondary | ICD-10-CM

## 2013-06-12 DIAGNOSIS — G4733 Obstructive sleep apnea (adult) (pediatric): Secondary | ICD-10-CM

## 2013-06-12 DIAGNOSIS — J309 Allergic rhinitis, unspecified: Secondary | ICD-10-CM

## 2013-06-12 NOTE — Progress Notes (Signed)
08/19/12- 62 yo F former smoker referred by Dr. Laural Benes for questionable asthma.  Has SOB that can be with exertion or rest, wheezing, chest tightness, and nonprod cough - -- symptoms started several months ago.  Had flu like illness/ wheezing in spring of 2013 treated., treated with Advair and rescue inhaler. Now gradually worse wheezing and dyspnea. Gaining weight. No prior history of asthma, pneumonia or allergy. Overall easier dyspnea on exertion since spring 2013. Rescue inhaler has been some help, used about 4 times per week. Has been consistent with Advair twice daily. PFT had shown moderate obstructive airways disease with air trapping and response to bronchodilator in October 2013. Environment: House with no basement. Has cat. Mold in wall paper at work place after water leak.  Smoked for 33 years, 1-1/2 packs per day, stopping in 2003. Daughter with allergic asthma. Aware of heartburn and reflux symptoms. Shortness of breath at rest and with exertion. Nonproductive cough. Atypical chest pains. Sneezing and itching. She sleeps alone but is concerned that she wakes herself snoring, gets tired during the day, and might have sleep apnea.  PFT 06/08/12/ [facility]- FEV1 1.75/73%, FEV1/FVC 0.63, FEF 25-75% 1.10/48%. RV 154%, TLC 120%, DLCO 112%. Moderate obstruction with response to bronchodilator hyperinflation and air trapping  // on return- needs allergy assessment- mold??//  11/15/12- 5 yo F former smoker referred by Dr. Laural Benes for questionable asthma.  Has SOB that can be with exertion or rest, wheezing, chest tightness, and nonprod cough - -- symptoms started several months ago.  FOLLOWS FOR: review with patient; breathing is about the same as previous visit; Pt had high BP after test and c/o SOB as well. Eyes and nose water now with spring season. Like sample Dulera 100. Little change in shortness of breath, wheeze or nonproductive cough. No chest pain or palpitation. She has decided to go  forward with  a sleep study after her dentist also commented that sleep apnea seems likely. - 11/15/12-96%, 95%, 97%, 447 m. BP at baseline 140/72 reached maximum 200/92 by in the past and was 180/86 after 2 minutes rest.normal oxygenation with exercise, but hypertension.  12/28/12- 69 yo F former smoker referred by Dr. Laural Benes for COPD/ asthma, Allergic rhinitis, OSA PCP Dr Danise Edge FOLLOWS FOR: SOB and wheezing at times-uses rescue inhaler for relief; review sleep study with patient. Breathing has been controlled. Blames pollen for increased need for rescue inhaler while continuing Dulera 100.Marland Kitchen She is curious about retrying Advair. NPSG 11/24/12- mild obstructive sleep apnea, AHI 11.4/ hr. Also noted nocturia x3 disturbed sleep.  02/13/13- 45 yo F former smoker referred by Dr. Laural Benes for COPD/ asthma, Allergic rhinitis, OSA PCP Dr Danise Edge FOLLOWS ZOX:WRUEA CPAP Auto/ Advanced every night anywhere from 4-8 hours(took chip from CPAP on Friday)  Stayed with Dulera, usually only once a day.  06/12/13- [name] former smoker referred by Dr. Laural Benes for COPD/ asthma, Allergic rhinitis, OSA PCP Dr Danise Edge FOLLOWS FOR: wears CPAPAuto/ [facility] every night;  CPAP overblows on Auto. Thinks it affects her teeth. Using nasal pillows.  Hx allergic rhinitis. Old building. C/o worse eye irritation and nasal congestion/ blowing.  Download indicates fixed CPAP 11.   ROS-see HPI Constitutional:   No-   weight loss, night sweats, fevers, chills, fatigue, lassitude. HEENT:   No-  headaches, difficulty swallowing, tooth/dental problems, sore throat,       + sneezing, itching, ear ache, nasal congestion, post nasal drip,  CV:  No- anginal  chest pain,  orthopnea, PND, swelling in lower extremities, anasarca,  dizziness, palpitations Resp: No-   shortness of breath with exertion or at rest.              No-   productive cough,   non-productive cough,  No- coughing up of blood.               No-   change in color of mucus.  + wheezing.   Skin: No-   rash or lesions. GI:  No-   +Heartburn,+ indigestion, abdominal pain, nausea, vomiting, GU:  MS:  No-   joint pain or swelling.   Neuro-     nothing unusual Psych:  No- change in mood or affect. + depression or anxiety.  No memory loss.  OBJ- Physical Exam General- Alert, Oriented, Affect-appropriate, Distress- none acute, obese Skin- rash-none, lesions- none, excoriation- none Lymphadenopathy- none Head- atraumatic            Eyes- Gross vision intact, PERRLA, conjunctivae and secretions clear            Ears- Hearing, canals-normal            Nose- +turbinate edema, no-Septal dev, mucus, polyps, erosion, perforation             Throat- Mallampati III , mucosa clear , drainage- none, tonsils- atrophic Neck- flexible , trachea midline, no stridor , thyroid nl, carotid no bruit Chest - symmetrical excursion , unlabored           Heart/CV- RRR , no murmur , no gallop  , no rub, nl s1 s2                           - JVD- none , edema- none, stasis changes- none, varices- none           Lung- clear to P&A, wheeze- none, cough- none , dullness-none, rub- none           Chest wall-  Abd- Br/ Gen/ Rectal- Not done, not indicated Extrem- cyanosis- none, clubbing, none, atrophy- none, strength- nl Neuro- grossly intact to observation

## 2013-06-12 NOTE — Patient Instructions (Signed)
Print script for CPAP mask of choice and supplies    Dx OSA  Order- DME [facility] change CPAP to autoPAP 5-12 cwp  Continue mask of choice, humidifier and supplies   Dx OSA  Order- lab  Allergy profile   Dx allergic rhinitis  Flu vax

## 2013-06-13 LAB — ALLERGY FULL PROFILE
Bahia Grass: <0.1 kU/L
Bermuda Grass: <0.1 kU/L
Box Elder IgE: <0.1 kU/L
Candida Albicans: <0.1 kU/L
Cat Dander: <0.1 kU/L
Curvularia lunata: <0.1 kU/L
Elm IgE: <0.1 kU/L
Fescue: <0.1 kU/L
G005 Rye, Perennial: <0.1 kU/L
G009 Red Top: <0.1 kU/L
Lamb's Quarters: <0.1 kU/L
Oak: <0.1 kU/L
Sycamore Tree: <0.1 kU/L
Timothy Grass: <0.1 kU/L

## 2013-06-14 NOTE — Progress Notes (Signed)
Quick Note:  Patient returned call. Advised of lab results / recs as stated by CY. Pt verbalized understanding and denied any questions. ______ 

## 2013-06-20 ENCOUNTER — Encounter: Payer: Self-pay | Admitting: Internal Medicine

## 2013-06-27 NOTE — Assessment & Plan Note (Signed)
Pressure has been uncomfortable on fixed 11 Plan- change back to Auto 5-12, Flu vax

## 2013-06-27 NOTE — Assessment & Plan Note (Signed)
Plan - Allergy profile

## 2013-07-18 ENCOUNTER — Telehealth: Payer: Self-pay | Admitting: Internal Medicine

## 2013-07-18 MED ORDER — FLUTICASONE-SALMETEROL 250-50 MCG/DOSE IN AEPB: 1.0000 | INHALATION_SPRAY | Freq: Two times a day (BID) | RESPIRATORY_TRACT | Status: DC

## 2013-07-18 NOTE — Telephone Encounter (Signed)
I spoke with pt and confirmed message. Pt [employer] is no longer going to cover dulera but will cover advair. Please advise Dr. Maple Hudson thanks  Allergies  Allergen Reactions  . Hydrocodone Nausea And Vomiting  . Breo Ellipta [Fluticasone Furoate-Vilanterol]     Light headed; dizzy spells, nasuea  . Codeine     vomiting  . Oxycodone Hcl     vomiting

## 2013-07-18 NOTE — Telephone Encounter (Signed)
Per CY-change Dulera to Advair 250/50 1 puff and RINSE BID with prn refills.

## 2013-07-18 NOTE — Telephone Encounter (Signed)
I called and spoke with pt and she reports to go ahead and send RX to [facility] in [place].

## 2013-07-27 ENCOUNTER — Other Ambulatory Visit: Payer: Self-pay | Admitting: Orthopaedic Surgery

## 2013-07-27 DIAGNOSIS — M25561 Pain in right knee: Secondary | ICD-10-CM

## 2013-07-31 ENCOUNTER — Ambulatory Visit
Admission: RE | Admit: 2013-07-31 | Discharge: 2013-07-31 | Disposition: A | Discharge status: Home / Self Care | Payer: BC Managed Care – PPO | Source: Ambulatory Visit | Attending: Orthopaedic Surgery | Admitting: Orthopaedic Surgery

## 2013-07-31 DIAGNOSIS — M25561 Pain in right knee: Secondary | ICD-10-CM

## 2013-09-06 ENCOUNTER — Telehealth: Payer: Self-pay | Admitting: Internal Medicine

## 2013-09-06 MED ORDER — ALBUTEROL SULFATE HFA 108 (90 BASE) MCG/ACT IN AERS: 2.0000 | INHALATION_SPRAY | RESPIRATORY_TRACT | Status: DC | PRN

## 2013-09-06 NOTE — Telephone Encounter (Signed)
Spoke with pt. Made her aware will send in her albuterol RX. Nothing further needed and RX sent

## 2014-03-02 ENCOUNTER — Other Ambulatory Visit: Payer: Self-pay | Admitting: Orthopaedic Surgery

## 2014-03-02 DIAGNOSIS — M25561 Pain in right knee: Secondary | ICD-10-CM

## 2014-03-07 ENCOUNTER — Ambulatory Visit
Admission: RE | Admit: 2014-03-07 | Discharge: 2014-03-07 | Disposition: A | Discharge status: Home / Self Care | Payer: BC Managed Care – PPO | Source: Ambulatory Visit | Attending: Orthopaedic Surgery | Admitting: Orthopaedic Surgery

## 2014-03-07 DIAGNOSIS — M25561 Pain in right knee: Secondary | ICD-10-CM

## 2014-04-17 ENCOUNTER — Other Ambulatory Visit (HOSPITAL_COMMUNITY): Payer: Self-pay | Admitting: Orthopaedic Surgery

## 2014-05-03 ENCOUNTER — Encounter (HOSPITAL_COMMUNITY): Payer: Self-pay

## 2014-05-08 ENCOUNTER — Encounter (HOSPITAL_COMMUNITY): Payer: Self-pay

## 2014-05-08 ENCOUNTER — Encounter (HOSPITAL_COMMUNITY)
Admission: RE | Admit: 2014-05-08 | Discharge: 2014-05-08 | Disposition: A | Discharge status: Home / Self Care | Payer: BC Managed Care – PPO | Source: Ambulatory Visit | Attending: Orthopaedic Surgery | Admitting: Orthopaedic Surgery

## 2014-05-08 DIAGNOSIS — Z01810 Encounter for preprocedural cardiovascular examination: Secondary | ICD-10-CM | POA: Insufficient documentation

## 2014-05-08 DIAGNOSIS — M1710 Unilateral primary osteoarthritis, unspecified knee: Secondary | ICD-10-CM | POA: Insufficient documentation

## 2014-05-08 DIAGNOSIS — Z01812 Encounter for preprocedural laboratory examination: Secondary | ICD-10-CM | POA: Insufficient documentation

## 2014-05-08 HISTORY — DX: Cardiac murmur, unspecified: R01.1

## 2014-05-08 HISTORY — DX: Nausea with vomiting, unspecified: R11.2

## 2014-05-08 HISTORY — DX: Other specified postprocedural states: Z98.890

## 2014-05-08 HISTORY — DX: Unspecified osteoarthritis, unspecified site: M19.90

## 2014-05-08 HISTORY — DX: Sleep apnea, unspecified: G47.30

## 2014-05-08 LAB — CBC
HEMATOCRIT: 43.6 % (ref 36.0–46.0)
Hemoglobin: 14.4 g/dL (ref 12.0–15.0)
MCH: 28.9 pg (ref 26.0–34.0)
MCHC: 33 g/dL (ref 30.0–36.0)
MCV: 87.4 fL (ref 78.0–100.0)
PLATELETS: 331 10*3/uL (ref 150–400)
RBC: 4.99 MIL/uL (ref 3.87–5.11)
RDW: 13.7 % (ref 11.5–15.5)
WBC: 8.4 10*3/uL (ref 4.0–10.5)

## 2014-05-08 LAB — COMPREHENSIVE METABOLIC PANEL
ALT: 43 U/L — ABNORMAL HIGH (ref 0–35)
ANION GAP: 14 (ref 5–15)
AST: 40 U/L — ABNORMAL HIGH (ref 0–37)
Albumin: 3.9 g/dL (ref 3.5–5.2)
Alkaline Phosphatase: 141 U/L — ABNORMAL HIGH (ref 39–117)
BUN: 12 mg/dL (ref 6–23)
CALCIUM: 9.9 mg/dL (ref 8.4–10.5)
CO2: 24 mEq/L (ref 19–32)
Chloride: 102 mEq/L (ref 96–112)
Creatinine, Ser: 0.77 mg/dL (ref 0.50–1.10)
GFR calc Af Amer: >90 mL/min (ref >90)
GFR calc non Af Amer: 89 mL/min — ABNORMAL LOW (ref >90)
Glucose, Bld: 86 mg/dL (ref 70–99)
Potassium: 4.1 mEq/L (ref 3.7–5.3)
Sodium: 140 mEq/L (ref 137–147)
TOTAL PROTEIN: 7.3 g/dL (ref 6.0–8.3)
Total Bilirubin: 0.4 mg/dL (ref 0.3–1.2)

## 2014-05-08 LAB — URINALYSIS, ROUTINE W REFLEX MICROSCOPIC
Bilirubin Urine: NEGATIVE
Glucose, UA: NEGATIVE mg/dL
HGB URINE DIPSTICK: NEGATIVE
Ketones, ur: NEGATIVE mg/dL
Leukocytes, UA: NEGATIVE
NITRITE: NEGATIVE
PROTEIN: NEGATIVE mg/dL
Specific Gravity, Urine: 1.021 (ref 1.005–1.030)
UROBILINOGEN UA: 0.2 mg/dL (ref 0.0–1.0)
pH: 6 (ref 5.0–8.0)

## 2014-05-08 LAB — ABO/RH: ABO/RH(D): O NEG

## 2014-05-08 LAB — SURGICAL PCR SCREEN
MRSA, PCR: NEGATIVE
STAPHYLOCOCCUS AUREUS: NEGATIVE

## 2014-05-08 LAB — PROTIME-INR
INR: 0.99 (ref 0.00–1.49)
PROTHROMBIN TIME: 13.1 s (ref 11.6–15.2)

## 2014-05-08 MED ORDER — CHLORHEXIDINE GLUCONATE 4 % EX LIQD: 60.0000 mL | Freq: Once | CUTANEOUS | Status: DC

## 2014-05-08 NOTE — Pre-Procedure Instructions (Signed)
BRENLY TRAWICK  05/08/2014   Your procedure is scheduled on:  Wednesday September 23 rd at 1230  Report to [facility] Admitting at 0830 AM.  Call this number if you have problems the morning of surgery: [phone]   Remember:   Do not eat food or drink liquids after midnight.   Take these medicines the morning of surgery with A SIP OF WATER: Prozac,Synthroid,and Prilosec. Stop Vitamins, Nsaids, Herbal medication and Aspirin   Do not wear jewelry, make-up or nail polish.  Do not wear lotions, powders, or perfumes. You may wear deodorant.  Do not shave 48 hours prior to surgery.   Do not bring valuables to the hospital.  [facility] is not responsible for any belongings or valuables.               Contacts, dentures or bridgework may not be worn into surgery.  Leave suitcase in the car. After surgery it may be brought to your room.  For patients admitted to the hospital, discharge time is determined by your                treatment team.               Patients discharged the day of surgery will not be allowed to drive home.    Special Instructions: [place] - Preparing for Surgery  Before surgery, you can play an important role.  Because skin is not sterile, your skin needs to be as free of germs as possible.  You can reduce the number of germs on you skin by washing with CHG (chlorahexidine gluconate) soap before surgery.  CHG is an antiseptic cleaner which kills germs and bonds with the skin to continue killing germs even after washing.  Please DO NOT use if you have an allergy to CHG or antibacterial soaps.  If your skin becomes reddened/irritated stop using the CHG and inform your nurse when you arrive at Short Stay.  Do not shave (including legs and underarms) for at least 48 hours prior to the first CHG shower.  You may shave your face.  Please follow these instructions carefully:   1.  Shower with CHG Soap the night before surgery and the                                 morning of Surgery.  2.  If you choose to wash your hair, wash your hair first as usual with your       normal shampoo.  3.  After you shampoo, rinse your hair and body thoroughly to remove the                      Shampoo.  4.  Use CHG as you would any other liquid soap.  You can apply chg directly       to the skin and wash gently with scrungie or a clean washcloth.  5.  Apply the CHG Soap to your body ONLY FROM THE NECK DOWN.        Do not use on open wounds or open sores.  Avoid contact with your eyes,       ears, mouth and genitals (private parts).  Wash genitals (private parts)       with your normal soap.  6.  Wash thoroughly, paying special attention to the area where your surgery  will be performed.  7.  Thoroughly rinse your body with warm water from the neck down.  8.  DO NOT shower/wash with your normal soap after using and rinsing off       the CHG Soap.  9.  Pat yourself dry with a clean towel.            10.  Wear clean pajamas.            11.  Place clean sheets on your bed the night of your first shower and do not        sleep with pets.  Day of Surgery  Do not apply any lotions/deoderants the morning of surgery.  Please wear clean clothes to the hospital/surgery center.      Please read over the following fact sheets that you were given: Pain Booklet, Coughing and Deep Breathing, Blood Transfusion Information, MRSA Information and Surgical Site Infection Prevention

## 2014-05-08 NOTE — H&P (Signed)
TOTAL KNEE ADMISSION H&P  Patient is being admitted for right total knee arthroplasty.  Subjective:  Chief Complaint:right knee pain.  HPI: Jennifer Barker, 63 y.o. female, has a history of pain and functional disability in the right knee due to arthritis and has failed non-surgical conservative treatments for greater than 12 weeks to includeNSAID's and/or analgesics, corticosteriod injections, flexibility and strengthening excercises, use of assistive devices and activity modification.  Onset of symptoms was gradual, starting 4 years ago with gradually worsening course since that time. The patient noted prior procedures on the knee to include  arthroscopy on the right knee(s).  Patient currently rates pain in the right knee(s) at 7 out of 10 with activity. Patient has night pain, worsening of pain with activity and weight bearing, pain that interferes with activities of daily living, crepitus and joint swelling.  Patient has evidence of  MRI shows a complex tear of the posterior horn of the medial meniscus with a fragment in the tibial gutter.  High-grade partial thickness loss in the medial compartment.  Large joint effusion.  X-rays demonstrate bone-on-bone changes.  Marginal osteophytes, minimal.  She has bone-on-bone of the medial compartment with arthroscopic grade 3 to 4 changes in the medial compartment more than lateral. by imaging studies.  There is no active infection.  Patient Active Problem List   Diagnosis Date Noted  . Allergic rhinitis, seasonal 11/20/2012  . 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 Obstructive sleep apnea 09/01/2012   Past Medical History  Diagnosis Date  . Fatty liver   . GERD (gastroesophageal reflux disease)   . Graves disease   . Hypothyroidism     post radioactive iodine to treat Graves' disease  . Depression   . Postmenopausal   . Carpal tunnel syndrome   . Overactive bladder   . Hypercholesteremia   . Vulvar dysplasia   . Adenomatous polyp   . Liver  hemangioma   . PONV (postoperative nausea and vomiting)   . Heart murmur     as a child  . Sleep apnea   . Arthritis     Past Surgical History  Procedure Laterality Date  . Vesicovaginal fistula closure w/ tah  1998  . Breast reduction surgery  1981  . Right shoulder surgery  2009  . Abdominal hysterectomy    . Knee arthroscopy Right 2014  . Tonsillectomy  1985  . Colonoscopy w/ biopsies and polypectomy      benign  . Tubal ligation      No prescriptions prior to admission   Allergies  Allergen Reactions  . Hydrocodone Nausea And Vomiting  . Breo Ellipta [Fluticasone Furoate-Vilanterol]     Light headed; dizzy spells, nasuea  . Codeine     vomiting  . Oxycodone Hcl     vomiting  . Strawberry Hives    History  Substance Use Topics  . Smoking status: Former Smoker -- 1.50 packs/day for 33 years    Types: Cigarettes    Quit date: 08/24/2001  . Smokeless tobacco: Never Used  . Alcohol Use: Yes     Comment: wine a couple times a week    Family History  Problem Relation Age of Onset  . Heart attack Father   . Lung cancer Mother   . Asthma Daughter   . Allergies Daughter      Review of Systems  Musculoskeletal: Positive for joint pain.  All other systems reviewed and are negative.   Objective:  Physical Exam  Constitutional: She is  oriented to person, place, and time. She appears well-developed and well-nourished.  HENT:  Head: Normocephalic and atraumatic.  Eyes: EOM are normal. Pupils are equal, round, and reactive to light.  Neck: Normal range of motion.  Cardiovascular: Normal rate and regular rhythm.   Respiratory: Effort normal.  GI: Soft.  Musculoskeletal:   Hip range of motion is full.  Distal reflexes are 2+.  Distal pulses are intact in the upper extremities as well.  Knee shows 3+ knee effusion.  She has medial joint line tenderness.  Collateral ligaments are stable.  She is ambulatory with a pronounced limp.  She lacks 5 degrees of reaching full  extension.  Sensory testing is normal.  Neurological: She is alert and oriented to person, place, and time.  Skin: Skin is warm and dry.  Psychiatric: She has a normal mood and affect.    Vital signs in last 24 hours:    Labs:   Estimated body mass index is 40.43 kg/(m^2) as calculated from the following:   Height as of 06/12/13: 5' 1.25" (1.556 m).   Weight as of 06/12/13: 97.886 kg (215 lb 12.8 oz).   Imaging Review Plain radiographs demonstrate moderate degenerative joint disease of the right knee(s). The overall alignment ismild varus. The bone quality appears to be adequate for age and reported activity level.  Assessment/Plan:  End stage arthritis, right knee   The patient history, physical examination, clinical judgment of the provider and imaging studies are consistent with end stage degenerative joint disease of the right knee(s) and total knee arthroplasty is deemed medically necessary. The treatment options including medical management, injection therapy arthroscopy and arthroplasty were discussed at length. The risks and benefits of total knee arthroplasty were presented and reviewed. The risks due to aseptic loosening, infection, stiffness, patella tracking problems, thromboembolic complications and other imponderables were discussed. The patient acknowledged the explanation, agreed to proceed with the plan and consent was signed. Patient is being admitted for inpatient treatment for surgery, pain control, PT, OT, prophylactic antibiotics, VTE prophylaxis, progressive ambulation and ADL's and discharge planning. The patient is planning to be discharged home with home health services

## 2014-05-09 LAB — TYPE AND SCREEN
ABO/RH(D): O NEG
Antibody Screen: NEGATIVE

## 2014-05-15 MED ORDER — DEXTROSE 5 % IV SOLN
3.0000 g | INTRAVENOUS | Status: AC
  Administered 2014-05-16: 3 g via INTRAVENOUS
  Filled 2014-05-15: qty 3000

## 2014-05-16 ENCOUNTER — Ambulatory Visit (HOSPITAL_COMMUNITY): Payer: BC Managed Care – PPO | Admitting: Certified Registered Nurse Anesthetist

## 2014-05-16 ENCOUNTER — Inpatient Hospital Stay (HOSPITAL_COMMUNITY)
Admission: RE | Admit: 2014-05-16 | Discharge: 2014-05-18 | DRG: 470 | Disposition: A | Discharge status: Home Health | Payer: BC Managed Care – PPO | Source: Ambulatory Visit | Attending: Orthopaedic Surgery | Admitting: Orthopaedic Surgery

## 2014-05-16 ENCOUNTER — Encounter (HOSPITAL_COMMUNITY)
Admission: RE | Disposition: A | Discharge status: Home Health | Payer: Self-pay | Source: Ambulatory Visit | Attending: Orthopaedic Surgery

## 2014-05-16 ENCOUNTER — Encounter (HOSPITAL_COMMUNITY): Payer: BC Managed Care – PPO | Admitting: Certified Registered Nurse Anesthetist

## 2014-05-16 ENCOUNTER — Encounter (HOSPITAL_COMMUNITY): Payer: Self-pay | Admitting: *Deleted

## 2014-05-16 DIAGNOSIS — E7800 Pure hypercholesterolemia, unspecified: Secondary | ICD-10-CM | POA: Diagnosis present

## 2014-05-16 DIAGNOSIS — Z87891 Personal history of nicotine dependence: Secondary | ICD-10-CM

## 2014-05-16 DIAGNOSIS — Z801 Family history of malignant neoplasm of trachea, bronchus and lung: Secondary | ICD-10-CM

## 2014-05-16 DIAGNOSIS — G8918 Other acute postprocedural pain: Secondary | ICD-10-CM | POA: Diagnosis not present

## 2014-05-16 DIAGNOSIS — Z9851 Tubal ligation status: Secondary | ICD-10-CM | POA: Diagnosis not present

## 2014-05-16 DIAGNOSIS — Z780 Asymptomatic menopausal state: Secondary | ICD-10-CM | POA: Diagnosis not present

## 2014-05-16 DIAGNOSIS — M1711 Unilateral primary osteoarthritis, right knee: Secondary | ICD-10-CM | POA: Diagnosis present

## 2014-05-16 DIAGNOSIS — K7689 Other specified diseases of liver: Secondary | ICD-10-CM | POA: Diagnosis present

## 2014-05-16 DIAGNOSIS — Z8249 Family history of ischemic heart disease and other diseases of the circulatory system: Secondary | ICD-10-CM

## 2014-05-16 DIAGNOSIS — F3289 Other specified depressive episodes: Secondary | ICD-10-CM | POA: Diagnosis present

## 2014-05-16 DIAGNOSIS — R110 Nausea: Secondary | ICD-10-CM | POA: Diagnosis not present

## 2014-05-16 DIAGNOSIS — Z825 Family history of asthma and other chronic lower respiratory diseases: Secondary | ICD-10-CM | POA: Diagnosis not present

## 2014-05-16 DIAGNOSIS — G4733 Obstructive sleep apnea (adult) (pediatric): Secondary | ICD-10-CM | POA: Diagnosis present

## 2014-05-16 DIAGNOSIS — K219 Gastro-esophageal reflux disease without esophagitis: Secondary | ICD-10-CM | POA: Diagnosis present

## 2014-05-16 DIAGNOSIS — J4489 Other specified chronic obstructive pulmonary disease: Secondary | ICD-10-CM | POA: Diagnosis present

## 2014-05-16 DIAGNOSIS — M1710 Unilateral primary osteoarthritis, unspecified knee: Principal | ICD-10-CM | POA: Diagnosis present

## 2014-05-16 DIAGNOSIS — J449 Chronic obstructive pulmonary disease, unspecified: Secondary | ICD-10-CM | POA: Diagnosis present

## 2014-05-16 DIAGNOSIS — F329 Major depressive disorder, single episode, unspecified: Secondary | ICD-10-CM | POA: Diagnosis present

## 2014-05-16 DIAGNOSIS — E039 Hypothyroidism, unspecified: Secondary | ICD-10-CM | POA: Diagnosis present

## 2014-05-16 DIAGNOSIS — E0500 Thyrotoxicosis with diffuse goiter without thyrotoxic crisis or storm: Secondary | ICD-10-CM | POA: Diagnosis present

## 2014-05-16 DIAGNOSIS — Z888 Allergy status to other drugs, medicaments and biological substances status: Secondary | ICD-10-CM | POA: Diagnosis not present

## 2014-05-16 DIAGNOSIS — M25569 Pain in unspecified knee: Secondary | ICD-10-CM | POA: Diagnosis present

## 2014-05-16 HISTORY — DX: Shortness of breath: R06.02

## 2014-05-16 HISTORY — DX: Chronic obstructive pulmonary disease, unspecified: J44.9

## 2014-05-16 HISTORY — PX: TOTAL KNEE ARTHROPLASTY: SHX125

## 2014-05-16 HISTORY — PX: KNEE ARTHROPLASTY: SHX992

## 2014-05-16 SURGERY — ARTHROPLASTY, KNEE, TOTAL, USING IMAGELESS COMPUTER-ASSISTED NAVIGATION
Anesthesia: Spinal | Site: Knee | Laterality: Right

## 2014-05-16 MED ORDER — HYDROMORPHONE HCL 1 MG/ML IJ SOLN
1.0000 mg | INTRAMUSCULAR | Status: DC | PRN
  Administered 2014-05-16 – 2014-05-18 (×5): 1 mg via INTRAVENOUS
  Filled 2014-05-16 (×5): qty 1

## 2014-05-16 MED ORDER — ONDANSETRON HCL 4 MG PO TABS
4.0000 mg | ORAL_TABLET | Freq: Four times a day (QID) | ORAL | Status: DC | PRN
  Administered 2014-05-17 – 2014-05-18 (×3): 4 mg via ORAL
  Filled 2014-05-16 (×4): qty 1

## 2014-05-16 MED ORDER — LEVOTHYROXINE SODIUM 75 MCG PO TABS
75.0000 ug | ORAL_TABLET | Freq: Every day | ORAL | Status: DC
  Administered 2014-05-17 – 2014-05-18 (×2): 75 ug via ORAL
  Filled 2014-05-16 (×3): qty 1

## 2014-05-16 MED ORDER — MEPERIDINE HCL 25 MG/ML IJ SOLN: 6.2500 mg | INTRAMUSCULAR | Status: DC | PRN

## 2014-05-16 MED ORDER — MENTHOL 3 MG MT LOZG: 1.0000 | LOZENGE | OROMUCOSAL | Status: DC | PRN

## 2014-05-16 MED ORDER — FENTANYL CITRATE 0.05 MG/ML IJ SOLN
INTRAMUSCULAR | Status: DC | PRN
  Administered 2014-05-16 (×3): 50 ug via INTRAVENOUS
  Administered 2014-05-16: 100 ug via INTRAVENOUS

## 2014-05-16 MED ORDER — ALBUTEROL SULFATE (2.5 MG/3ML) 0.083% IN NEBU
2.5000 mg | INHALATION_SOLUTION | RESPIRATORY_TRACT | Status: DC | PRN
Start: 2014-05-16 — End: 2014-05-18

## 2014-05-16 MED ORDER — MIDAZOLAM HCL 2 MG/2ML IJ SOLN
INTRAMUSCULAR | Status: AC
  Filled 2014-05-16: qty 2

## 2014-05-16 MED ORDER — KETOROLAC TROMETHAMINE 15 MG/ML IJ SOLN
INTRAMUSCULAR | Status: AC
  Filled 2014-05-16: qty 1

## 2014-05-16 MED ORDER — ACETAMINOPHEN 650 MG RE SUPP: 650.0000 mg | Freq: Four times a day (QID) | RECTAL | Status: DC | PRN

## 2014-05-16 MED ORDER — PHENYLEPHRINE HCL 10 MG/ML IJ SOLN
INTRAMUSCULAR | Status: DC | PRN
  Administered 2014-05-16 (×6): 80 ug via INTRAVENOUS

## 2014-05-16 MED ORDER — METHOCARBAMOL 500 MG PO TABS
500.0000 mg | ORAL_TABLET | Freq: Four times a day (QID) | ORAL | Status: DC | PRN
  Administered 2014-05-16 – 2014-05-18 (×6): 500 mg via ORAL
  Filled 2014-05-16 (×5): qty 1

## 2014-05-16 MED ORDER — ACETAMINOPHEN 325 MG PO TABS: 650.0000 mg | ORAL_TABLET | Freq: Four times a day (QID) | ORAL | Status: DC | PRN

## 2014-05-16 MED ORDER — DOCUSATE SODIUM 100 MG PO CAPS
100.0000 mg | ORAL_CAPSULE | Freq: Two times a day (BID) | ORAL | Status: DC
  Administered 2014-05-16 – 2014-05-18 (×4): 100 mg via ORAL
  Filled 2014-05-16 (×5): qty 1

## 2014-05-16 MED ORDER — PANTOPRAZOLE SODIUM 40 MG PO TBEC
40.0000 mg | DELAYED_RELEASE_TABLET | Freq: Every day | ORAL | Status: DC
  Administered 2014-05-17 – 2014-05-18 (×2): 40 mg via ORAL
  Filled 2014-05-16: qty 1

## 2014-05-16 MED ORDER — OXYCODONE HCL 5 MG PO TABS
5.0000 mg | ORAL_TABLET | ORAL | Status: DC | PRN
  Administered 2014-05-16 – 2014-05-18 (×7): 10 mg via ORAL
  Filled 2014-05-16 (×7): qty 2

## 2014-05-16 MED ORDER — ASPIRIN EC 325 MG PO TBEC
325.0000 mg | DELAYED_RELEASE_TABLET | Freq: Every day | ORAL | Status: DC
Start: 2014-05-16 — End: 2014-09-17

## 2014-05-16 MED ORDER — BISACODYL 10 MG RE SUPP: 10.0000 mg | Freq: Every day | RECTAL | Status: DC | PRN

## 2014-05-16 MED ORDER — ADULT MULTIVITAMIN W/MINERALS CH
1.0000 | ORAL_TABLET | Freq: Every day | ORAL | Status: DC
  Administered 2014-05-17 – 2014-05-18 (×2): 1 via ORAL
  Filled 2014-05-16 (×2): qty 1

## 2014-05-16 MED ORDER — METOCLOPRAMIDE HCL 5 MG/ML IJ SOLN: 5.0000 mg | Freq: Three times a day (TID) | INTRAMUSCULAR | Status: DC | PRN

## 2014-05-16 MED ORDER — KCL IN DEXTROSE-NACL 20-5-0.45 MEQ/L-%-% IV SOLN
INTRAVENOUS | Status: DC
  Administered 2014-05-16: 22:00:00 via INTRAVENOUS
  Filled 2014-05-16 (×5): qty 1000

## 2014-05-16 MED ORDER — FENTANYL CITRATE 0.05 MG/ML IJ SOLN
INTRAMUSCULAR | Status: AC
  Filled 2014-05-16: qty 2

## 2014-05-16 MED ORDER — PROPOFOL 10 MG/ML IV BOLUS
INTRAVENOUS | Status: AC
  Filled 2014-05-16: qty 20

## 2014-05-16 MED ORDER — FLUOXETINE HCL 20 MG PO TABS
20.0000 mg | ORAL_TABLET | Freq: Every day | ORAL | Status: DC
  Administered 2014-05-16 – 2014-05-18 (×3): 20 mg via ORAL
  Filled 2014-05-16 (×3): qty 1

## 2014-05-16 MED ORDER — METHOCARBAMOL 500 MG PO TABS
ORAL_TABLET | ORAL | Status: AC
  Filled 2014-05-16: qty 1

## 2014-05-16 MED ORDER — ASPIRIN EC 325 MG PO TBEC
325.0000 mg | DELAYED_RELEASE_TABLET | Freq: Every day | ORAL | Status: DC
  Administered 2014-05-17 – 2014-05-18 (×2): 325 mg via ORAL
  Filled 2014-05-16 (×3): qty 1

## 2014-05-16 MED ORDER — HYDROMORPHONE HCL 1 MG/ML IJ SOLN
INTRAMUSCULAR | Status: AC
  Filled 2014-05-16: qty 1

## 2014-05-16 MED ORDER — SODIUM CHLORIDE 0.9 % IR SOLN
Status: DC | PRN
  Administered 2014-05-16: 3000 mL

## 2014-05-16 MED ORDER — KETOROLAC TROMETHAMINE 15 MG/ML IJ SOLN
15.0000 mg | Freq: Four times a day (QID) | INTRAMUSCULAR | Status: AC
  Administered 2014-05-16 – 2014-05-17 (×4): 15 mg via INTRAVENOUS
  Filled 2014-05-16 (×3): qty 1

## 2014-05-16 MED ORDER — LACTATED RINGERS IV SOLN
INTRAVENOUS | Status: DC | PRN
  Administered 2014-05-16 (×3): via INTRAVENOUS

## 2014-05-16 MED ORDER — ONDANSETRON HCL 4 MG/2ML IJ SOLN
4.0000 mg | Freq: Four times a day (QID) | INTRAMUSCULAR | Status: DC | PRN
  Administered 2014-05-16 – 2014-05-18 (×2): 4 mg via INTRAVENOUS
  Filled 2014-05-16 (×2): qty 2

## 2014-05-16 MED ORDER — OMEPRAZOLE MAGNESIUM 20 MG PO TBEC: 20.0000 mg | DELAYED_RELEASE_TABLET | Freq: Every day | ORAL | Status: DC

## 2014-05-16 MED ORDER — LACTATED RINGERS IV SOLN: INTRAVENOUS | Status: DC

## 2014-05-16 MED ORDER — SENNOSIDES-DOCUSATE SODIUM 8.6-50 MG PO TABS: 1.0000 | ORAL_TABLET | Freq: Every evening | ORAL | Status: DC | PRN

## 2014-05-16 MED ORDER — FENTANYL CITRATE 0.05 MG/ML IJ SOLN
INTRAMUSCULAR | Status: AC
  Filled 2014-05-16: qty 5

## 2014-05-16 MED ORDER — PHENOL 1.4 % MT LIQD: 1.0000 | OROMUCOSAL | Status: DC | PRN

## 2014-05-16 MED ORDER — PROPYLENE GLYCOL 0.6 % OP SOLN: 1.0000 [drp] | Freq: Every day | OPHTHALMIC | Status: DC

## 2014-05-16 MED ORDER — OXYCODONE-ACETAMINOPHEN 5-325 MG PO TABS: 1.0000 | ORAL_TABLET | ORAL | Status: DC | PRN

## 2014-05-16 MED ORDER — PROPOFOL 10 MG/ML IV BOLUS
INTRAVENOUS | Status: DC | PRN
  Administered 2014-05-16 (×6): 20 mg via INTRAVENOUS

## 2014-05-16 MED ORDER — METHOCARBAMOL 500 MG PO TABS: 500.0000 mg | ORAL_TABLET | Freq: Four times a day (QID) | ORAL | Status: DC | PRN

## 2014-05-16 MED ORDER — ONDANSETRON HCL 4 MG/2ML IJ SOLN
INTRAMUSCULAR | Status: DC | PRN
  Administered 2014-05-16: 4 mg via INTRAVENOUS

## 2014-05-16 MED ORDER — LACTATED RINGERS IV SOLN
INTRAVENOUS | Status: DC
  Administered 2014-05-16: 11:00:00 via INTRAVENOUS

## 2014-05-16 MED ORDER — FENTANYL CITRATE 0.05 MG/ML IJ SOLN
25.0000 ug | INTRAMUSCULAR | Status: DC | PRN
  Administered 2014-05-16: 50 ug via INTRAVENOUS
  Administered 2014-05-16 (×2): 25 ug via INTRAVENOUS

## 2014-05-16 MED ORDER — BUPIVACAINE HCL (PF) 0.25 % IJ SOLN
INTRAMUSCULAR | Status: AC
  Filled 2014-05-16: qty 30

## 2014-05-16 MED ORDER — BUPIVACAINE IN DEXTROSE 0.75-8.25 % IT SOLN
INTRATHECAL | Status: DC | PRN
  Administered 2014-05-16: 1.5 mL via INTRATHECAL

## 2014-05-16 MED ORDER — MIDAZOLAM HCL 5 MG/5ML IJ SOLN
INTRAMUSCULAR | Status: DC | PRN
  Administered 2014-05-16: 2 mg via INTRAVENOUS

## 2014-05-16 MED ORDER — METOCLOPRAMIDE HCL 10 MG PO TABS: 5.0000 mg | ORAL_TABLET | Freq: Three times a day (TID) | ORAL | Status: DC | PRN

## 2014-05-16 MED ORDER — FLEET ENEMA 7-19 GM/118ML RE ENEM: 1.0000 | ENEMA | Freq: Once | RECTAL | Status: AC | PRN

## 2014-05-16 MED ORDER — MOMETASONE FURO-FORMOTEROL FUM 200-5 MCG/ACT IN AERO
2.0000 | INHALATION_SPRAY | Freq: Two times a day (BID) | RESPIRATORY_TRACT | Status: DC
  Administered 2014-05-16 – 2014-05-18 (×4): 2 via RESPIRATORY_TRACT
  Filled 2014-05-16: qty 8.8

## 2014-05-16 MED ORDER — ONE-DAILY MULTI VITAMINS PO TABS: 1.0000 | ORAL_TABLET | Freq: Every day | ORAL | Status: DC

## 2014-05-16 MED ORDER — HYDROMORPHONE HCL 2 MG PO TABS
2.0000 mg | ORAL_TABLET | ORAL | Status: DC | PRN
  Administered 2014-05-17 (×5): 2 mg via ORAL
  Filled 2014-05-16 (×5): qty 1

## 2014-05-16 MED ORDER — ALBUTEROL SULFATE HFA 108 (90 BASE) MCG/ACT IN AERS: 2.0000 | INHALATION_SPRAY | RESPIRATORY_TRACT | Status: DC | PRN

## 2014-05-16 MED ORDER — PROMETHAZINE HCL 25 MG/ML IJ SOLN: 6.2500 mg | INTRAMUSCULAR | Status: DC | PRN

## 2014-05-16 MED ORDER — POLYVINYL ALCOHOL 1.4 % OP SOLN
1.0000 [drp] | Freq: Every day | OPHTHALMIC | Status: DC
  Administered 2014-05-18: 1 [drp] via OPHTHALMIC
  Filled 2014-05-16: qty 15

## 2014-05-16 MED ORDER — DIPHENHYDRAMINE HCL 12.5 MG/5ML PO ELIX: 12.5000 mg | ORAL_SOLUTION | ORAL | Status: DC | PRN

## 2014-05-16 MED ORDER — PROPOFOL INFUSION 10 MG/ML OPTIME
INTRAVENOUS | Status: DC | PRN
  Administered 2014-05-16: 50 ug/kg/min via INTRAVENOUS

## 2014-05-16 MED ORDER — DEXTROSE 5 % IV SOLN
500.0000 mg | Freq: Four times a day (QID) | INTRAVENOUS | Status: DC | PRN
  Filled 2014-05-16: qty 5

## 2014-05-16 SURGICAL SUPPLY — 79 items
APL SKNCLS STERI-STRIP NONHPOA (GAUZE/BANDAGES/DRESSINGS) ×1
BANDAGE ELASTIC 4 VELCRO ST LF (GAUZE/BANDAGES/DRESSINGS) ×1 IMPLANT
BANDAGE ESMARK 6X9 LF (GAUZE/BANDAGES/DRESSINGS) ×1 IMPLANT
BENZOIN TINCTURE PRP APPL 2/3 (GAUZE/BANDAGES/DRESSINGS) ×3 IMPLANT
BLADE SAGITTAL 25.0X1.19X90 (BLADE) ×2 IMPLANT
BLADE SAGITTAL 25.0X1.19X90MM (BLADE) ×1
BLADE SAW SGTL 13X75X1.27 (BLADE) ×3 IMPLANT
BNDG CMPR 9X6 STRL LF SNTH (GAUZE/BANDAGES/DRESSINGS) ×1
BNDG CMPR MED 10X6 ELC LF (GAUZE/BANDAGES/DRESSINGS) ×1
BNDG ELASTIC 6X10 VLCR STRL LF (GAUZE/BANDAGES/DRESSINGS) ×3 IMPLANT
BNDG ESMARK 6X9 LF (GAUZE/BANDAGES/DRESSINGS) ×3
BOWL SMART MIX CTS (DISPOSABLE) ×3 IMPLANT
CAPT RP KNEE ×2 IMPLANT
CEMENT HV SMART SET (Cement) ×6 IMPLANT
CLOSURE STERI-STRIP 1/2X4 (GAUZE/BANDAGES/DRESSINGS) ×1
CLOSURE WOUND 1/2 X4 (GAUZE/BANDAGES/DRESSINGS)
CLSR STERI-STRIP ANTIMIC 1/2X4 (GAUZE/BANDAGES/DRESSINGS) ×1 IMPLANT
COVER BACK TABLE 24X17X13 BIG (DRAPES) IMPLANT
COVER SURGICAL LIGHT HANDLE (MISCELLANEOUS) ×3 IMPLANT
CUFF TOURNIQUET SINGLE 34IN LL (TOURNIQUET CUFF) ×3 IMPLANT
CUFF TOURNIQUET SINGLE 44IN (TOURNIQUET CUFF) IMPLANT
DRAPE ORTHO SPLIT 77X108 STRL (DRAPES) ×6
DRAPE SURG ORHT 6 SPLT 77X108 (DRAPES) ×2 IMPLANT
DRAPE U-SHAPE 47X51 STRL (DRAPES) ×3 IMPLANT
DRSG PAD ABDOMINAL 8X10 ST (GAUZE/BANDAGES/DRESSINGS) ×6 IMPLANT
DURAPREP 26ML APPLICATOR (WOUND CARE) ×3 IMPLANT
ELECT REM PT RETURN 9FT ADLT (ELECTROSURGICAL) ×3
ELECTRODE REM PT RTRN 9FT ADLT (ELECTROSURGICAL) ×1 IMPLANT
FACESHIELD WRAPAROUND (MASK) ×6 IMPLANT
FACESHIELD WRAPAROUND OR TEAM (MASK) ×1 IMPLANT
GAUZE SPONGE 4X4 12PLY STRL (GAUZE/BANDAGES/DRESSINGS) ×1 IMPLANT
GAUZE XEROFORM 5X9 LF (GAUZE/BANDAGES/DRESSINGS) ×3 IMPLANT
GLOVE BIO SURGEON STRL SZ7.5 (GLOVE) ×4 IMPLANT
GLOVE BIOGEL PI IND STRL 7.0 (GLOVE) IMPLANT
GLOVE BIOGEL PI IND STRL 7.5 (GLOVE) ×1 IMPLANT
GLOVE BIOGEL PI IND STRL 8 (GLOVE) ×1 IMPLANT
GLOVE BIOGEL PI INDICATOR 7.0 (GLOVE) ×2
GLOVE BIOGEL PI INDICATOR 7.5 (GLOVE) ×2
GLOVE BIOGEL PI INDICATOR 8 (GLOVE) ×2
GLOVE ECLIPSE 7.0 STRL STRAW (GLOVE) ×5 IMPLANT
GLOVE ORTHO TXT STRL SZ7.5 (GLOVE) ×5 IMPLANT
GLOVE SURG SS PI 6.5 STRL IVOR (GLOVE) ×2 IMPLANT
GOWN STRL REUS W/ TWL LRG LVL3 (GOWN DISPOSABLE) ×3 IMPLANT
GOWN STRL REUS W/ TWL XL LVL3 (GOWN DISPOSABLE) ×1 IMPLANT
GOWN STRL REUS W/TWL LRG LVL3 (GOWN DISPOSABLE) ×9
GOWN STRL REUS W/TWL XL LVL3 (GOWN DISPOSABLE) ×3
HANDPIECE INTERPULSE COAX TIP (DISPOSABLE) ×3
IMMOBILIZER KNEE 22 (SOFTGOODS) ×2 IMPLANT
IMMOBILIZER KNEE 22 UNIV (SOFTGOODS) ×3 IMPLANT
KIT BASIN OR (CUSTOM PROCEDURE TRAY) ×3 IMPLANT
KIT ROOM TURNOVER OR (KITS) ×3 IMPLANT
MANIFOLD NEPTUNE II (INSTRUMENTS) ×3 IMPLANT
MARKER SPHERE PSV REFLC THRD 5 (MARKER) ×9 IMPLANT
NDL 1/2 CIR MAYO (NEEDLE) ×1 IMPLANT
NDL HYPO 25GX1X1/2 BEV (NEEDLE) ×1 IMPLANT
NEEDLE 1/2 CIR MAYO (NEEDLE) IMPLANT
NEEDLE HYPO 25GX1X1/2 BEV (NEEDLE) ×3 IMPLANT
NS IRRIG 1000ML POUR BTL (IV SOLUTION) ×3 IMPLANT
PACK TOTAL JOINT (CUSTOM PROCEDURE TRAY) ×3 IMPLANT
PAD ARMBOARD 7.5X6 YLW CONV (MISCELLANEOUS) ×6 IMPLANT
PAD CAST 4YDX4 CTTN HI CHSV (CAST SUPPLIES) ×1 IMPLANT
PADDING CAST COTTON 4X4 STRL (CAST SUPPLIES)
PADDING CAST COTTON 6X4 STRL (CAST SUPPLIES) ×3 IMPLANT
PIN SCHANZ 4MM 130MM (PIN) ×12 IMPLANT
SET HNDPC FAN SPRY TIP SCT (DISPOSABLE) ×1 IMPLANT
SPONGE GAUZE 4X4 12PLY STER LF (GAUZE/BANDAGES/DRESSINGS) ×2 IMPLANT
STAPLER VISISTAT 35W (STAPLE) ×3 IMPLANT
STRIP CLOSURE SKIN 1/2X4 (GAUZE/BANDAGES/DRESSINGS) ×2 IMPLANT
SUCTION FRAZIER TIP 10 FR DISP (SUCTIONS) ×3 IMPLANT
SUT ETHIBOND NAB CT1 #1 30IN (SUTURE) ×3 IMPLANT
SUT VIC AB 2-0 CT1 27 (SUTURE) ×6
SUT VIC AB 2-0 CT1 TAPERPNT 27 (SUTURE) ×2 IMPLANT
SUT VICRYL 4-0 PS2 18IN ABS (SUTURE) ×3 IMPLANT
SUT VICRYL AB 2 0 TIES (SUTURE) ×3 IMPLANT
SYR CONTROL 10ML LL (SYRINGE) ×3 IMPLANT
TOWEL OR 17X24 6PK STRL BLUE (TOWEL DISPOSABLE) ×3 IMPLANT
TOWEL OR 17X26 10 PK STRL BLUE (TOWEL DISPOSABLE) ×3 IMPLANT
TRAY FOLEY CATH 16FRSI W/METER (SET/KITS/TRAYS/PACK) ×3 IMPLANT
WATER STERILE IRR 1000ML POUR (IV SOLUTION) ×3 IMPLANT

## 2014-05-16 NOTE — Progress Notes (Signed)
Orthopedic Tech Progress Note Patient Details:  Jennifer Barker 1951/04/19 [id number]  CPM Right Knee Right Knee Flexion (Degrees): 60 Right Knee Extension (Degrees): 0 Additional Comments: trapeze bar patient helper   Hildred Priest 05/16/2014, 5:09 PM

## 2014-05-16 NOTE — Brief Op Note (Cosign Needed)
05/16/2014  3:50 PM  PATIENT:  Jennifer Barker  63 y.o. female  PRE-OPERATIVE DIAGNOSIS:  Osteoarthritis Right Knee  POST-OPERATIVE DIAGNOSIS:  Osteoarthritis Right Knee  PROCEDURE:  Procedure(s): COMPUTER ASSISTED RIGHT TOTAL KNEE ARTHROPLASTY (Right)  SURGEON:  Surgeon(s) and Role:    * Marybelle Killings, MD - Primary  PHYSICIAN ASSISTANT: [facility]  ASSISTANTS: none   ANESTHESIA:   general  EBL:  Total I/O In: 2000 [I.V.:2000] Out: 200 [Urine:100; Blood:100]  BLOOD ADMINISTERED:none  DRAINS: none   LOCAL MEDICATIONS USED:  NONE  SPECIMEN:  No Specimen  DISPOSITION OF SPECIMEN:  N/A  COUNTS:  YES  TOURNIQUET:   Total Tourniquet Time Documented: Thigh (Right) - 75 minutes Total: Thigh (Right) - 75 minutes   DICTATION: .Note written in EPIC  PLAN OF CARE: Admit to inpatient   PATIENT DISPOSITION:  PACU - hemodynamically stable.   Delay start of Pharmacological VTE agent (>24hrs) due to surgical blood loss or risk of bleeding: no

## 2014-05-16 NOTE — Anesthesia Procedure Notes (Signed)
Spinal Patient location during procedure: OR Staffing Anesthesiologist: Maris Abascal Performed by: anesthesiologist  Preanesthetic Checklist Completed: patient identified, site marked, surgical consent, pre-op evaluation, timeout performed, IV checked, risks and benefits discussed and monitors and equipment checked Spinal Block Patient position: sitting Prep: Betadine Patient monitoring: heart rate, continuous pulse ox and blood pressure Approach: right paramedian Location: L4-5 Injection technique: single-shot Needle Needle type: Sprotte  Needle gauge: 24 G Needle length: 9 cm Additional Notes Expiration date of kit checked and confirmed. Patient tolerated procedure well, without complications.     

## 2014-05-16 NOTE — Interval H&P Note (Signed)
History and Physical Interval Note:  05/16/2014 12:23 PM  Jennifer Barker  has presented today for surgery, with the diagnosis of Osteoarthritis Right Knee  The various methods of treatment have been discussed with the patient and family. After consideration of risks, benefits and other options for treatment, the patient has consented to  Procedure(s): COMPUTER ASSISTED RIGHT TOTAL KNEE ARTHROPLASTY (Right) as a surgical intervention .  The patient's history has been reviewed, patient examined, no change in status, stable for surgery.  I have reviewed the patient's chart and labs.  Questions were answered to the patient's satisfaction.     Corlette Ciano C

## 2014-05-16 NOTE — Transfer of Care (Signed)
Immediate Anesthesia Transfer of Care Note  Patient: Jennifer Barker  Procedure(s) Performed: Procedure(s): COMPUTER ASSISTED RIGHT TOTAL KNEE ARTHROPLASTY (Right)  Patient Location: PACU  Anesthesia Type:Spinal  Level of Consciousness: awake, alert , oriented and patient cooperative  Airway & Oxygen Therapy: Patient Spontanous Breathing  Post-op Assessment: Report given to PACU RN, Post -op Vital signs reviewed and stable and Patient moving all extremities  Post vital signs: Reviewed and stable  Complications: No apparent anesthesia complications

## 2014-05-16 NOTE — Anesthesia Preprocedure Evaluation (Addendum)
Anesthesia Evaluation  Patient identified by MRN, date of birth, ID band Patient awake    Reviewed: Allergy & Precautions, H&P , NPO status , Patient's Chart, lab work & pertinent test results  History of Anesthesia Complications (+) PONV  Airway Mallampati: II TM Distance: >3 FB Neck ROM: Full    Dental no notable dental hx.    Pulmonary asthma , sleep apnea and Continuous Positive Airway Pressure Ventilation , COPDformer smoker,  breath sounds clear to auscultation  Pulmonary exam normal       Cardiovascular negative cardio ROS  Rhythm:Regular Rate:Normal     Neuro/Psych negative neurological ROS  negative psych ROS   GI/Hepatic negative GI ROS, Neg liver ROS,   Endo/Other  negative endocrine ROS  Renal/GU negative Renal ROS  negative genitourinary   Musculoskeletal negative musculoskeletal ROS (+)   Abdominal   Peds negative pediatric ROS (+)  Hematology negative hematology ROS (+)   Anesthesia Other Findings   Reproductive/Obstetrics negative OB ROS                         Anesthesia Physical Anesthesia Plan  ASA: III  Anesthesia Plan: Spinal   Post-op Pain Management:    Induction:   Airway Management Planned: Simple Face Mask  Additional Equipment:   Intra-op Plan:   Post-operative Plan:   Informed Consent: I have reviewed the patients History and Physical, chart, labs and discussed the procedure including the risks, benefits and alternatives for the proposed anesthesia with the patient or authorized representative who has indicated his/her understanding and acceptance.   Dental advisory given  Plan Discussed with: CRNA  Anesthesia Plan Comments:        Anesthesia Quick Evaluation

## 2014-05-16 NOTE — Evaluation (Signed)
Physical Therapy Evaluation Patient Details Name: JOSEFINE FUHR MRN: [id number] DOB: 02-23-1951 Today's Date: 05/16/2014   History of Present Illness  63 y.o. female admitted to [facility] on 05/16/14 for elective R TKA.  Pt with significant PMHx of Grave's disease, depression, overactive bladder, and R shoulder surgery.    Clinical Impression  Pt is mobilizing well for her first time up.  Min assist with RW.  Limited by post-op pain today.  I anticipate that she will progress well enough to d/c home with her daughter's help and HHPT f/u.  She will need a RW for home use.   PT to follow acutely for deficits listed below.       Follow Up Recommendations Home health PT;Supervision for mobility/OOB    Equipment Recommendations  Rolling walker with 5" wheels    Recommendations for Other Services   NA    Precautions / Restrictions Precautions Precautions: Knee Precaution Comments: reviewed KI use, WBAT status, and no pillow under operated knee rule.  Required Braces or Orthoses: Knee Immobilizer - Right Knee Immobilizer - Right: On at all times Restrictions Weight Bearing Restrictions: Yes RLE Weight Bearing: Weight bearing as tolerated      Mobility  Bed Mobility Overal bed mobility: Needs Assistance Bed Mobility: Supine to Sit     Supine to sit: Min assist     General bed mobility comments: min assist to help progress right leg over EOB.  Verbal cues for 1/2 bridge technique, and hand placement/sequencing.   Transfers Overall transfer level: Needs assistance Equipment used: Rolling walker (2 wheeled) Transfers: Sit to/from [facility] Sit to Stand: Min assist Stand pivot transfers: Min assist       General transfer comment: Min assist with RW to transition to standing.  Verbal cues for safe hand placement.  Min assist to stabilize trunk during transitions.    Ambulation/Gait             General Gait Details: Not tested due to pain.               Balance Overall balance assessment: Needs assistance Sitting-balance support: Feet supported;No upper extremity supported Sitting balance-Leahy Scale: Good     Standing balance support: Bilateral upper extremity supported Standing balance-Leahy Scale: Poor Standing balance comment: needs external assist to maintain standing.                              Pertinent Vitals/Pain Pain Assessment: 0-10 Pain Score: 9  Pain Location: knee, right Pain Descriptors / Indicators: Aching;Burning;Constant Pain Intervention(s): Limited activity within patient's tolerance;Monitored during session;Repositioned;RN gave pain meds during session;Ice applied    Home Living Family/patient expects to be discharged to:: Private residence Living Arrangements: Alone Available Help at Discharge: Family;Available 24 hours/day (daughter coming to stay with her) Type of Home: House Home Access: Stairs to enter Entrance Stairs-Rails: Right Entrance Stairs-Number of Steps: 6 Home Layout: One level Home Equipment: Cane - single point      Prior Function Level of Independence: Independent with assistive device(s)         Comments: used cane PTA for gait, drives, works and an Optometrist.  Likes to Occupational hygienist Dominance   Dominant Hand: Right    Extremity/Trunk Assessment   Upper Extremity Assessment: Defer to OT evaluation           Lower Extremity Assessment: RLE deficits/detail RLE Deficits / Details: right leg with normal  post op pain and weakness.  Ankle 3/5, knee 2/5, hip 2/5    Cervical / Trunk Assessment: Normal  Communication   Communication: No difficulties  Cognition Arousal/Alertness: Awake/alert Behavior During Therapy: WFL for tasks assessed/performed Overall Cognitive Status: Within Functional Limits for tasks assessed                               Assessment/Plan    PT Assessment Patient needs continued PT services  PT Diagnosis  Difficulty walking;Abnormality of gait;Generalized weakness;Acute pain   PT Problem List Decreased strength;Decreased range of motion;Decreased activity tolerance;Decreased balance;Decreased mobility;Decreased knowledge of use of DME;Decreased knowledge of precautions;Pain  PT Treatment Interventions DME instruction;Gait training;Stair training;Functional mobility training;Therapeutic activities;Therapeutic exercise;Balance training;Neuromuscular re-education;Patient/family education;Modalities;Manual techniques   PT Goals (Current goals can be found in the Care Plan section) Acute Rehab PT Goals Patient Stated Goal: to get better, decrease pain PT Goal Formulation: With patient/family Time For Goal Achievement: 05/23/14 Potential to Achieve Goals: Good    Frequency 7X/week    End of Session Equipment Utilized During Treatment: Right knee immobilizer Activity Tolerance: Patient limited by pain Patient left: in chair;with call bell/phone within reach;with family/visitor present Nurse Communication: Mobility status         Time: 0174-9449 PT Time Calculation (min): 35 min   Charges:   PT Evaluation $Initial PT Evaluation Tier I: 1 Procedure PT Treatments $Therapeutic Activity: 8-22 mins        Emmani Lesueur B. [place], [place], DPT [phone]   05/16/2014, 7:05 PM

## 2014-05-16 NOTE — Discharge Instructions (Signed)
Keep knee incision dry for 5 days post op then may wet while bathing. Therapy daily and goal full extension and greater than 90 degrees flexion. Call if fever or chills or increased drainage. Go to ER if acutely short of breath or call for ambulance. Return for follow up in 2 weeks. May full weight bear on the surgical leg unless told otherwise. Use knee immobilizer until able to straight leg raise off bed with knee stable. In house walking for first 2 weeks.

## 2014-05-16 NOTE — Progress Notes (Signed)
Patient declining CPAP tonight, left her home interface in car.  Told patient we could still supply a mask for her use, still wants to wait for her own.  Advised patient to have nurse call if she changes her mind.

## 2014-05-16 NOTE — Progress Notes (Signed)
Orthopedic Tech Progress Note Patient Details:  Jennifer Barker April 14, 1951 [id number]  Patient ID: [name], female   DOB: August 11, 1951, 64 y.o.   MRN: [id number] Viewed order from doctor's order list   Hildred Priest 05/16/2014, 5:10 PM

## 2014-05-17 ENCOUNTER — Encounter (HOSPITAL_COMMUNITY): Payer: Self-pay | Admitting: General Practice

## 2014-05-17 LAB — CBC
HCT: 33.2 % — ABNORMAL LOW (ref 36.0–46.0)
HEMOGLOBIN: 10.9 g/dL — AB (ref 12.0–15.0)
MCH: 29 pg (ref 26.0–34.0)
MCHC: 32.8 g/dL (ref 30.0–36.0)
MCV: 88.3 fL (ref 78.0–100.0)
Platelets: 243 10*3/uL (ref 150–400)
RBC: 3.76 MIL/uL — ABNORMAL LOW (ref 3.87–5.11)
RDW: 13.9 % (ref 11.5–15.5)
WBC: 9.5 10*3/uL (ref 4.0–10.5)

## 2014-05-17 LAB — BASIC METABOLIC PANEL
Anion gap: 11 (ref 5–15)
BUN: 5 mg/dL — ABNORMAL LOW (ref 6–23)
CHLORIDE: 99 meq/L (ref 96–112)
CO2: 25 mEq/L (ref 19–32)
Calcium: 8.4 mg/dL (ref 8.4–10.5)
Creatinine, Ser: 0.63 mg/dL (ref 0.50–1.10)
GFR calc Af Amer: >90 mL/min (ref >90)
GFR calc non Af Amer: >90 mL/min (ref >90)
Glucose, Bld: 145 mg/dL — ABNORMAL HIGH (ref 70–99)
POTASSIUM: 3.8 meq/L (ref 3.7–5.3)
Sodium: 135 mEq/L — ABNORMAL LOW (ref 137–147)

## 2014-05-17 NOTE — Progress Notes (Signed)
Physical Therapy Treatment Patient Details Name: Jennifer Barker MRN: [id number] DOB: 12-12-1950 Today's Date: 05/17/2014    History of Present Illness 63 y.o. female admitted to [facility] on 05/16/14 for elective R TKA.  Pt with significant PMHx of Grave's disease, depression, overactive bladder, and R shoulder surgery.      PT Comments    Patient progressing well with overall mobility and ambulation. Will attempt stairs in AM in prep for DC home. Anticipate patient will be ready for DC tomorrow based on mobility and progression towards goals.   Follow Up Recommendations  Home health PT;Supervision for mobility/OOB     Equipment Recommendations  Rolling walker with 5" wheels    Recommendations for Other Services       Precautions / Restrictions Precautions Precautions: Knee Precaution Comments: reviewed KI use, WBAT status, and no pillow under operated knee rule.  Required Braces or Orthoses: Knee Immobilizer - Right Knee Immobilizer - Right: On when out of bed or walking Restrictions Weight Bearing Restrictions: No RLE Weight Bearing: Weight bearing as tolerated    Mobility  Bed Mobility Overal bed mobility: Needs Assistance Bed Mobility: Supine to Sit     Supine to sit: Supervision     General bed mobility comments: Cues for technique and positioning but able to complete without cues and with HOB flat and no rails  Transfers Overall transfer level: Needs assistance Equipment used: Rolling walker (2 wheeled) Transfers: Sit to/from Stand Sit to Stand: Min guard         General transfer comment: MG for safety. Cues to ensure correct hand placement  Ambulation/Gait Ambulation/Gait assistance: Min guard Ambulation Distance (Feet): 100 Feet Assistive device: Rolling walker (2 wheeled) Gait Pattern/deviations: Step-to pattern;Decreased step length - right;Decreased stance time - left   Gait velocity interpretation: Below normal speed for age/gender General Gait  Details: Cues for heel strike and for safety with use of RW   Stairs            Wheelchair Mobility    Modified Rankin (Stroke Patients Only)       Balance Overall balance assessment: Needs assistance Sitting-balance support: No upper extremity supported;Feet supported Sitting balance-Leahy Scale: Good     Standing balance support: Single extremity supported Standing balance-Leahy Scale: Fair                      Cognition Arousal/Alertness: Awake/alert Behavior During Therapy: WFL for tasks assessed/performed Overall Cognitive Status: Within Functional Limits for tasks assessed                      Exercises Total Joint Exercises Quad Sets: AROM;Right;10 reps Heel Slides: AAROM;Right;10 reps Hip ABduction/ADduction: AAROM;Right;10 reps Straight Leg Raises: AAROM;Right;10 reps    General Comments        Pertinent Vitals/Pain Pain Assessment: 0-10 Pain Score: 8  Pain Location: R knee Pain Descriptors / Indicators: Sore Pain Intervention(s): Monitored during session    Home Living Family/patient expects to be discharged to:: Private residence Living Arrangements: Alone Available Help at Discharge: Family;Available 24 hours/day Type of Home: House Home Access: Stairs to enter Entrance Stairs-Rails: Right Home Layout: One level Home Equipment: Cane - single point;Bedside commode      Prior Function Level of Independence: Independent with assistive device(s)      Comments: used cane PTA for gait, drives, works and an Optometrist.  Likes to quilt   PT Goals (current goals can now be found in the care plan  section) Acute Rehab PT Goals Patient Stated Goal: home perhaps tomorrow Progress towards PT goals: Progressing toward goals    Frequency  7X/week    PT Plan Current plan remains appropriate    Co-evaluation             End of Session Equipment Utilized During Treatment: Right knee immobilizer Activity Tolerance: Patient  tolerated treatment well Patient left: in bed;in CPM;with family/visitor present;with call bell/phone within reach     Time: 1310-1337 PT Time Calculation (min): 27 min  Charges:  $Gait Training: 8-22 mins $Therapeutic Exercise: 8-22 mins                    G Codes:      Jacqualyn Posey 05/17/2014, 2:24 PM 05/17/2014 Jacqualyn Posey PTA [phone] pager [phone] office

## 2014-05-17 NOTE — Plan of Care (Signed)
Problem: Consults Goal: Diagnosis- Total Joint Replacement Primary Total Knee Right     

## 2014-05-17 NOTE — Progress Notes (Signed)
Utilization review completed.  

## 2014-05-17 NOTE — Anesthesia Postprocedure Evaluation (Addendum)
  Anesthesia Post-op Note  Patient: Jennifer Barker  Procedure(s) Performed: Procedure(s) (LRB): COMPUTER ASSISTED RIGHT TOTAL KNEE ARTHROPLASTY (Right)  Patient Location: PACU  Anesthesia Type: spinal  Level of Consciousness: awake and alert   Airway and Oxygen Therapy: Patient Spontanous Breathing  Post-op Pain: mild  Post-op Assessment: Post-op Vital signs reviewed, Patient's Cardiovascular Status Stable, Respiratory Function Stable, Patent Airway and No signs of Nausea or vomiting  Last Vitals:  Filed Vitals:   05/17/14 0438  BP: 119/58  Pulse: 65  Temp: 36.4 C  Resp: 14    Post-op Vital Signs: stable   Complications: No apparent anesthesia complications

## 2014-05-17 NOTE — Progress Notes (Signed)
Physical Therapy Treatment Patient Details Name: Jennifer Barker MRN: [id number] DOB: 1951/07/06 Today's Date: 05/17/2014    History of Present Illness 63 y.o. female admitted to [facility] on 05/16/14 for elective R TKA.  Pt with significant PMHx of Grave's disease, depression, overactive bladder, and R shoulder surgery.      PT Comments    Patient progressing well with ambulation this session and is highly motivated to progress with ambulation. Will continue with current POC and attempt longer hall ambulation this afternoon  Follow Up Recommendations  Home health PT;Supervision for mobility/OOB     Equipment Recommendations  Rolling walker with 5" wheels    Recommendations for Other Services       Precautions / Restrictions Precautions Precautions: Knee Precaution Comments: reviewed KI use, WBAT status, and no pillow under operated knee rule.  Required Braces or Orthoses: Knee Immobilizer - Right Knee Immobilizer - Right: On at all times Restrictions RLE Weight Bearing: Weight bearing as tolerated    Mobility  Bed Mobility Overal bed mobility: Needs Assistance Bed Mobility: Supine to Sit     Supine to sit: Min guard     General bed mobility comments: Cues for positioning. Did use rails to assist   Transfers Overall transfer level: Needs assistance Equipment used: Rolling walker (2 wheeled)   Sit to Stand: Min assist         General transfer comment: Min assist with RW to transition to standing.  Verbal cues for safe hand placement.  Min assist to ensure balance and weight shift  Ambulation/Gait Ambulation/Gait assistance: Min guard Ambulation Distance (Feet): 30 Feet Assistive device: Rolling walker (2 wheeled) Gait Pattern/deviations: Step-to pattern;Decreased step length - right;Decreased stance time - left   Gait velocity interpretation: Below normal speed for age/gender General Gait Details: Cues for gait pattern, use of RW and posture within RW. No LOB and  decreased heel strike    Stairs            Wheelchair Mobility    Modified Rankin (Stroke Patients Only)       Balance                                    Cognition Arousal/Alertness: Awake/alert Behavior During Therapy: WFL for tasks assessed/performed Overall Cognitive Status: Within Functional Limits for tasks assessed                      Exercises Total Joint Exercises Quad Sets: AROM;Right;10 reps Heel Slides: AAROM;Right;10 reps Hip ABduction/ADduction: AAROM;Right;10 reps Straight Leg Raises: AAROM;Right;10 reps    General Comments        Pertinent Vitals/Pain Pain Assessment: No/denies pain Pain Score: 7  Pain Location: R knee Pain Descriptors / Indicators: Aching;Sore Pain Intervention(s): Monitored during session;Premedicated before session    Home Living Family/patient expects to be discharged to:: Private residence Living Arrangements: Alone                  Prior Function            PT Goals (current goals can now be found in the care plan section) Progress towards PT goals: Progressing toward goals    Frequency  7X/week    PT Plan Current plan remains appropriate    Co-evaluation             End of Session Equipment Utilized During Treatment: Right knee immobilizer Activity Tolerance:  Patient tolerated treatment well Patient left: in chair;with call bell/phone within reach;with family/visitor present     Time: 1035-1101 PT Time Calculation (min): 26 min  Charges:  $Gait Training: 8-22 mins $Therapeutic Exercise: 8-22 mins                    G Codes:      Jacqualyn Posey 05/17/2014, 12:49 PM 05/17/2014 Jacqualyn Posey PTA [phone] pager [phone] office

## 2014-05-17 NOTE — Progress Notes (Signed)
RT placed patient on CPAP pressure 6, 21%.

## 2014-05-17 NOTE — Op Note (Signed)
[name], [name] NO.:  [id number]  MEDICAL RECORD NO.:  [id number]  LOCATION:  5N05C                        FACILITY:  [facility]  PHYSICIAN:  Mark C. Lorin Mercy, M.D.    DATE OF BIRTH:  Jan 16, 1951  DATE OF PROCEDURE:  05/16/2014 DATE OF DISCHARGE:                              OPERATIVE REPORT   PREOPERATIVE DIAGNOSIS:  Right knee osteoarthritis.  POSTOPERATIVE DIAGNOSIS:  Right knee osteoarthritis.  PROCEDURE:  Total knee arthroplasty-computer assist.  SURGEON:  Mark C. Lorin Mercy, M.D.  ASSISTANT:  Phillips Hay, PA-C, medically necessary and present for the entire procedure.  ANESTHESIA:  Spinal.  TOURNIQUET TIME:  1 hour 14 minutes.  ESTIMATED BLOOD LOSS:  Less than 100 mL.  COMPONENTS:  DePuy 2.5 [employer], 2.5 tibia 10 mm PCL retaining rotating platform spacer, 35 mm patella.  PROCEDURE IN DETAIL:  After induction of spinal anesthesia, standard prepping and draping, lateral post and heel bump, the entire extremity was prepped from the tip of the toe to the tourniquet with DuraPrep, 3 g Ancef was given prophylactically due to the patient's weight. Impervious stockinette, Coban, and extremity sheets drapes, sterile skin marker, Betadine, and Steri-Drape was used to seal the skin.  Leg was wrapped in Esmarch.  Time-out was completed.  Midline incision was made. Medial retinacular incision was made in parapatellar.  Patella was flipped over, 10 mm taken off the patella, size 35 mm and holes were drilled.  The spurs removed.  There was more degenerative changes in patellofemoral and medial compartment and lateral.  There was chronic synovitis changes throughout the knee with reddish synovium that did not appear like rheumatoid arthritis.  Meniscal remnants were resected.  ACL and PCL were resected.  Pins were placed for computer navigation.  Mid tibia stab incision made for the tibial pins and 2 femur pins were placed inside the  incision.  The patient had 40 degrees varus, 40 degrees hyperextension, 9 mm were taken off the femur, sizing was 2.5, and Chamfer cuts were made.  Box cut was not made until the tibia was cut.  When the tibia was cut only 6 mm were taken off the tibia.  So additional 10 mm had to be removed, touched up taking a little bit more bone lateral posterior to correct the slope and slight varus.  Once this was corrected, keel was prepared.  Trial sizers showed good fit 10 mm bearing pulse lavage.  Vacuum mixing of the cement, lug holes had been drilled.  Meticulous drying and cementing of the tibia first followed by femur, 10 mm spacer, permanent 1 was inserted.  Patella was held with patellar clamp.  Cement was hardened 15 minutes and all excessive cement had been removed.  Tourniquet was deflated.  Hemostasis was obtained and then standard closure in layers with 1 Ethibond 2-0 Vicryl subcuticular closure. Tincture of benzoin, Steri-Strips, and 4-0 Vicryl in the computer and holes mid tibia.  The patient tolerated the procedure well.  Postop dressing and knee immobilizer was applied.     Mark C. Lorin Mercy, M.D.     MCY/MEDQ  D:  05/16/2014  T:  05/17/2014  Job:  [id number]

## 2014-05-17 NOTE — Progress Notes (Signed)
Pt set up with CPAP, set up with pts nasal pillows at [facility] per pt's request. Pt did not want to wear at this time.

## 2014-05-17 NOTE — Progress Notes (Signed)
Subjective: 1 Day Post-Op Procedure(s) (LRB): COMPUTER ASSISTED RIGHT TOTAL KNEE ARTHROPLASTY (Right) Patient reports pain as moderate.   Bad pain last night  " no one checked on me and they did not bring me my pain meds"  Objective: Vital signs in last 24 hours: Temp:  [97.5 F (36.4 C)-98.5 F (36.9 C)] 97.5 F (36.4 C) (09/24 0438) Pulse Rate:  [54-67] 65 (09/24 0438) Resp:  [9-21] 14 (09/24 0438) BP: (97-146)/(44-86) 119/58 mmHg (09/24 0438) SpO2:  [87 %-100 %] 97 % (09/24 0700) Weight:  [104.327 kg (230 lb)] 104.327 kg (230 lb) (09/23 1033)  Intake/Output from previous day: 09/23 0701 - 09/24 0700 In: 2300 [I.V.:2300] Out: 600 [Urine:500; Blood:100] Intake/Output this shift:     Recent Labs  05/17/14 0614  HGB 10.9*    Recent Labs  05/17/14 0614  WBC 9.5  RBC 3.76*  HCT 33.2*  PLT 243   No results found for this basename: NA, K, CL, CO2, BUN, CREATININE, GLUCOSE, CALCIUM,  in the last 72 hours No results found for this basename: LABPT, INR,  in the last 72 hours  Neurologically intact  Assessment/Plan: 1 Day Post-Op Procedure(s) (LRB): COMPUTER ASSISTED RIGHT TOTAL KNEE ARTHROPLASTY (Right) Up with therapy  Shepard Keltz C 05/17/2014, 8:13 AM

## 2014-05-17 NOTE — Evaluation (Signed)
Occupational Therapy Evaluation and Discharge Patient Details Name: Jennifer Barker MRN: [id number] DOB: 31-Jul-1951 Today's Date: 05/17/2014    History of Present Illness 63 y.o. female admitted to [facility] on 05/16/14 for elective R TKA.  Pt with significant PMHx of Grave's disease, depression, overactive bladder, and R shoulder surgery.     Clinical Impression   This 63 yo female admitted and underwent above presents to acute OT with all education completed with her and her daughter. No further OT needs, we will sign off.    Follow Up Recommendations  No OT follow up    Equipment Recommendations  None recommended by OT       Precautions / Restrictions Precautions Precautions: Knee Precaution Comments: reviewed KI use, WBAT status, and no pillow under operated knee rule.  Required Braces or Orthoses: Knee Immobilizer - Right Knee Immobilizer - Right: On when out of bed or walking Restrictions Weight Bearing Restrictions: No RLE Weight Bearing: Weight bearing as tolerated      Mobility Bed Mobility     General bed mobility comments: Pt up in recliner upon my arrival  Transfers Overall transfer level: Needs assistance Equipment used: Rolling walker (2 wheeled) Transfers: Sit to/from Stand Sit to Stand: Min guard (VCs for safe hand placement)            Balance Overall balance assessment: Needs assistance Sitting-balance support: No upper extremity supported;Feet supported Sitting balance-Leahy Scale: Good     Standing balance support: Single extremity supported Standing balance-Leahy Scale: Fair                              ADL Overall ADL's : Needs assistance/impaired Eating/Feeding: Independent;Sitting   Grooming: Set up;Sitting   Upper Body Bathing: Set up;Sitting   Lower Body Bathing: Moderate assistance (with min guard A)   Upper Body Dressing : Set up;Sitting   Lower Body Dressing: Moderate assistance (with min guard A sit<>stand)    Toilet Transfer: Min guard;Ambulation;RW (recliner>step into tub>sit on 3n1)   Toileting- Clothing Manipulation and Hygiene: Min guard;Sit to/from stand   Tub/ Shower Transfer: Minimal assistance;Ambulation;Rolling walker;3 in 1      I went over most energy efficient dressing sequence with pt and her daughter               Pertinent Vitals/Pain Pain Assessment: 0-10 Pain Score: 8  Pain Location: right knee Pain Descriptors / Indicators: Sharp (with stepping over into the tub) Pain Intervention(s): Monitored during session;RN gave pain meds during session     Hand Dominance Right   Extremity/Trunk Assessment Upper Extremity Assessment Upper Extremity Assessment: Overall WFL for tasks assessed           Communication Communication Communication: No difficulties   Cognition Arousal/Alertness: Awake/alert Behavior During Therapy: WFL for tasks assessed/performed Overall Cognitive Status: Within Functional Limits for tasks assessed                                Home Living Family/patient expects to be discharged to:: Private residence Living Arrangements: Alone Available Help at Discharge: Family;Available 24 hours/day Type of Home: House Home Access: Stairs to enter [employer] of Steps: 6 Entrance Stairs-Rails: Right Home Layout: One level     Bathroom Shower/Tub: Tub/shower unit;Curtain Shower/tub characteristics: Architectural technologist: Standard     Home Equipment: Cane - single point;Bedside commode  Prior Functioning/Environment Level of Independence: Independent with assistive device(s)        Comments: used cane PTA for gait, drives, works and an Optometrist.  Likes to quilt             OT Goals(Current goals can be found in the care plan section) Acute Rehab OT Goals Patient Stated Goal: home perhaps tomorrow  OT Frequency:                End of Session Equipment Utilized During Treatment: Gait  belt;Rolling walker  Activity Tolerance: Patient tolerated treatment well Patient left:  (walking back to room with PT)   Time: 1250-1320 OT Time Calculation (min): 30 min Charges:  OT General Charges $OT Visit: 1 Procedure OT Evaluation $Initial OT Evaluation Tier I: 1 Procedure OT Treatments $Self Care/Home Management : 23-37 mins  Almon Register [phone] 05/17/2014, 1:47 PM

## 2014-05-18 ENCOUNTER — Encounter (HOSPITAL_COMMUNITY): Payer: Self-pay | Admitting: Orthopaedic Surgery

## 2014-05-18 LAB — CBC
HEMATOCRIT: 33.5 % — AB (ref 36.0–46.0)
Hemoglobin: 10.9 g/dL — ABNORMAL LOW (ref 12.0–15.0)
MCH: 28.8 pg (ref 26.0–34.0)
MCHC: 32.5 g/dL (ref 30.0–36.0)
MCV: 88.4 fL (ref 78.0–100.0)
PLATELETS: 251 10*3/uL (ref 150–400)
RBC: 3.79 MIL/uL — AB (ref 3.87–5.11)
RDW: 13.9 % (ref 11.5–15.5)
WBC: 11.4 10*3/uL — AB (ref 4.0–10.5)

## 2014-05-18 MED ORDER — ONDANSETRON HCL 4 MG PO TABS: 4.0000 mg | ORAL_TABLET | Freq: Three times a day (TID) | ORAL | Status: DC | PRN

## 2014-05-18 NOTE — Progress Notes (Signed)
Subjective: 2 Days Post-Op Procedure(s) (LRB): COMPUTER ASSISTED RIGHT TOTAL KNEE ARTHROPLASTY (Right) Patient reports pain as 3 on 0-10 scale.    Objective: Vital signs in last 24 hours: Temp:  [98 F (36.7 C)-98.7 F (37.1 C)] 98.7 F (37.1 C) (09/25 1116) Pulse Rate:  [71-89] 89 (09/25 0500) Resp:  [16-18] 18 (09/25 1116) BP: (116-134)/(41-64) 134/64 mmHg (09/25 1116) SpO2:  [92 %-96 %] 92 % (09/25 0500)  Intake/Output from previous day: 09/24 0701 - 09/25 0700 In: 465 [P.O.:240; I.V.:225] Out: -  Intake/Output this shift:     Recent Labs  05/17/14 0614 05/18/14 0635  HGB 10.9* 10.9*    Recent Labs  05/17/14 0614 05/18/14 0635  WBC 9.5 11.4*  RBC 3.76* 3.79*  HCT 33.2* 33.5*  PLT 243 251    Recent Labs  05/17/14 0614  NA 135*  K 3.8  CL 99  CO2 25  BUN 5*  CREATININE 0.63  GLUCOSE 145*  CALCIUM 8.4   No results found for this basename: LABPT, INR,  in the last 72 hours  Neurologically intact  Assessment/Plan: 2 Days Post-Op Procedure(s) (LRB): COMPUTER ASSISTED RIGHT TOTAL KNEE ARTHROPLASTY (Right) Up with therapy   Did stairs home today after afternoon PT session.    zofran Rx for Nausea at her request  Gahel Safley C 05/18/2014, 11:49 AM

## 2014-05-18 NOTE — Progress Notes (Signed)
Physical Therapy Treatment Patient Details Name: Jennifer Barker MRN: [id number] DOB: May 13, 1951 Today's Date: 05/18/2014    History of Present Illness 63 y.o. female admitted to [facility] on 05/16/14 for elective R TKA.  Pt with significant PMHx of Grave's disease, depression, overactive bladder, and R shoulder surgery.      PT Comments    Patient able to complete stair training. Patient safe to D/C from a mobility standpoint based on progression towards goals set on PT eval.    Follow Up Recommendations  Home health PT;Supervision for mobility/OOB     Equipment Recommendations  Rolling walker with 5" wheels    Recommendations for Other Services       Precautions / Restrictions Precautions Precautions: Knee Required Braces or Orthoses: Knee Immobilizer - Right Knee Immobilizer - Right: On when out of bed or walking Restrictions RLE Weight Bearing: Weight bearing as tolerated    Mobility  Bed Mobility               General bed mobility comments: Patient up in recliner before and after session  Transfers Overall transfer level: Needs assistance Equipment used: Rolling walker (2 wheeled) Transfers: Sit to/from Stand Sit to Stand: Supervision         General transfer comment: Supervision for safety  Ambulation/Gait Ambulation/Gait assistance: Supervision Ambulation Distance (Feet): 110 Feet Assistive device: Rolling walker (2 wheeled) Gait Pattern/deviations: Step-through pattern;Decreased stride length     General Gait Details: Cues for heel strike and for safety with use of RW. Patient tends to get too quick and out of control increasing her pain   Stairs Stairs: Yes Stairs assistance: Min guard Stair Management: Step to pattern;Backwards Number of Stairs: 4 General stair comments: A to ensure balance. Cues for sequency and technique  Wheelchair Mobility    Modified Rankin (Stroke Patients Only)       Balance                                    Cognition Arousal/Alertness: Awake/alert Behavior During Therapy: WFL for tasks assessed/performed Overall Cognitive Status: Within Functional Limits for tasks assessed                      Exercises Total Joint Exercises Quad Sets: AROM;Right;10 reps Heel Slides: AAROM;Right;10 reps Hip ABduction/ADduction: AAROM;Right;10 reps Straight Leg Raises: AAROM;Right;10 reps    General Comments        Pertinent Vitals/Pain Pain Score: 6  Pain Location: R knee Pain Descriptors / Indicators: Sore Pain Intervention(s): Monitored during session    Home Living                      Prior Function            PT Goals (current goals can now be found in the care plan section) Progress towards PT goals: Progressing toward goals    Frequency  7X/week    PT Plan Current plan remains appropriate    Co-evaluation             End of Session Equipment Utilized During Treatment: Right knee immobilizer Activity Tolerance: Patient tolerated treatment well Patient left: in chair;with call bell/phone within reach;with family/visitor present     Time: 2878-6767 PT Time Calculation (min): 16 min  Charges:  $Gait Training: 8-22 mins $Therapeutic Exercise: 8-22 mins  G Codes:      Jacqualyn Posey 05/18/2014, 1:48 PM 05/18/2014 Jacqualyn Posey PTA [phone] pager [phone] office

## 2014-05-18 NOTE — Progress Notes (Signed)
Physical Therapy Treatment Patient Details Name: Jennifer Barker MRN: [id number] DOB: 12-11-1950 Today's Date: 05/18/2014    History of Present Illness 63 y.o. female admitted to [facility] on 05/16/14 for elective R TKA.  Pt with significant PMHx of Grave's disease, depression, overactive bladder, and R shoulder surgery.      PT Comments    Patient is progressing well with mobility. Able to practice a couple of stairs this AM but had increased pain. Will follow up this afternoon with more stair training.   Follow Up Recommendations  Home health PT;Supervision for mobility/OOB     Equipment Recommendations  Rolling walker with 5" wheels    Recommendations for Other Services       Precautions / Restrictions Precautions Precautions: Knee Required Braces or Orthoses: Knee Immobilizer - Right Knee Immobilizer - Right: On when out of bed or walking Restrictions RLE Weight Bearing: Weight bearing as tolerated    Mobility  Bed Mobility               General bed mobility comments: Patient up in recliner before and after session  Transfers Overall transfer level: Needs assistance Equipment used: Rolling walker (2 wheeled)   Sit to Stand: Min guard         General transfer comment: MG for safety. Cues to ensure correct hand placement  Ambulation/Gait Ambulation/Gait assistance: Min guard Ambulation Distance (Feet): 200 Feet Assistive device: Rolling walker (2 wheeled) Gait Pattern/deviations: Step-through pattern;Decreased stride length     General Gait Details: Cues for heel strike and for safety with use of RW. Patient tends to get too quick and out of control increasing her pain   Stairs Stairs: Yes Stairs assistance: Min assist Stair Management: Step to pattern;Sideways;One rail Right Number of Stairs: 3 General stair comments: A to ensure balance. Cues for sequency and technique  Wheelchair Mobility    Modified Rankin (Stroke Patients Only)       Balance                                     Cognition Arousal/Alertness: Awake/alert   Overall Cognitive Status: Within Functional Limits for tasks assessed                      Exercises Total Joint Exercises Quad Sets: AROM;Right;10 reps Heel Slides: AAROM;Right;10 reps Hip ABduction/ADduction: AAROM;Right;10 reps Straight Leg Raises: AAROM;Right;10 reps    General Comments        Pertinent Vitals/Pain Pain Score: 9  Pain Location: R knee Pain Intervention(s): RN gave pain meds during session    Home Living                      Prior Function            PT Goals (current goals can now be found in the care plan section) Progress towards PT goals: Progressing toward goals    Frequency  7X/week    PT Plan Current plan remains appropriate    Co-evaluation             End of Session Equipment Utilized During Treatment: Right knee immobilizer Activity Tolerance: Patient tolerated treatment well Patient left: in chair;with call bell/phone within reach;with family/visitor present     Time: 0825-0853 PT Time Calculation (min): 28 min  Charges:  $Gait Training: 8-22 mins $Therapeutic Exercise: 8-22 mins  G Codes:      Jacqualyn Posey 05/18/2014, 12:00 PM  05/18/2014 Jacqualyn Posey PTA [phone] pager [phone] office

## 2014-05-18 NOTE — Care Management Note (Signed)
CARE MANAGEMENT NOTE 05/18/2014  Patient:  Jennifer Barker,Jennifer Barker   Account Number:  [id number]  Date Initiated:  05/17/2014  Documentation initiated by:  Ricki Miller  Subjective/Objective Assessment:   63 yr old female admitted with osteoarthritis of right knee, s/p right total knee arthroplasty.     Action/Plan:   Case manager spoke with patient and daughter concerning home health and DME needs. Choice offered, referral called to [facility] liaison [facility]. Patient has rolling walker.   Anticipated DC Date:  05/18/2014   Anticipated DC Plan:  [place]  CM consult      PAC Choice  [place]   Choice offered to / List presented to:  C-1 Patient   DME arranged  3-N-1      DME agency  [place] arranged  [place].   Status of service:  Completed, signed off Medicare Important Message given?   (If response is "NO", the following Medicare IM given date fields will be blank) Date Medicare IM given:   Medicare IM given by:   Date Additional Medicare IM given:   Additional Medicare IM given by:    Discharge Disposition:  [place]  Per UR Regulation:  Reviewed for med. necessity/level of care/duration of stay

## 2014-05-21 NOTE — Discharge Summary (Signed)
Physician Discharge Summary  Patient ID: ERNEST ORR MRN: [id number] DOB/AGE: 61/29/1952 61 y.o.  Admit date: 05/16/2014 Discharge date: 05/18/2014  Admission Diagnoses:  Osteoarthritis of right knee  Discharge Diagnoses:  Principal Problem:   Osteoarthritis of right knee Active Problems:   COPD with asthma   Obstructive sleep apnea   Past Medical History  Diagnosis Date  . Fatty liver   . GERD (gastroesophageal reflux disease)   . Graves disease   . Hypothyroidism     post radioactive iodine to treat Graves' disease  . Depression   . Postmenopausal   . Carpal tunnel syndrome   . Overactive bladder   . Hypercholesteremia   . Vulvar dysplasia   . Adenomatous polyp   . Liver hemangioma   . PONV (postoperative nausea and vomiting)   . Heart murmur     as a child  . Sleep apnea   . Arthritis   . COPD (chronic obstructive pulmonary disease)   . Shortness of breath     Surgeries: Procedure(s): COMPUTER ASSISTED RIGHT TOTAL KNEE ARTHROPLASTY on 05/16/2014   Consultants (if any):  none  Discharged Condition: Improved  Hospital Course: TAKESHA STEGER is an 63 y.o. female who was admitted 05/16/2014 with a diagnosis of Osteoarthritis of right knee and went to the operating room on 05/16/2014 and underwent the above named procedures.    She was given perioperative antibiotics:  Anti-infectives   Start     Dose/Rate Route Frequency Ordered Stop   05/16/14 0600  ceFAZolin (ANCEF) 3 g in dextrose 5 % 50 mL IVPB     3 g 160 mL/hr over 30 Minutes Intravenous On call to O.R. 05/15/14 1405 05/16/14 1315    .  She was given sequential compression devices, early ambulation, and aspirin for DVT prophylaxis.  She benefited maximally from the hospital stay and there were no complications.    Recent vital signs:  Filed Vitals:   05/18/14 1116  BP: 134/64  Pulse:   Temp: 98.7 F (37.1 C)  Resp: 18    Recent laboratory studies:  Lab Results  Component Value Date   HGB 10.9* 05/18/2014   HGB 10.9* 05/17/2014   HGB 14.4 05/08/2014   Lab Results  Component Value Date   WBC 11.4* 05/18/2014   PLT 251 05/18/2014   Lab Results  Component Value Date   INR 0.99 05/08/2014   Lab Results  Component Value Date   NA 135* 05/17/2014   K 3.8 05/17/2014   CL 99 05/17/2014   CO2 25 05/17/2014   BUN 5* 05/17/2014   CREATININE 0.63 05/17/2014   GLUCOSE 145* 05/17/2014    Discharge Medications:     Medication List         albuterol 108 (90 BASE) MCG/ACT inhaler  Commonly known as:  PROVENTIL HFA;VENTOLIN HFA  Inhale 2 puffs into the lungs every 4 (four) hours as needed.     aspirin EC 325 MG tablet  Take 1 tablet (325 mg total) by mouth daily.     calcium carbonate 500 MG chewable tablet  Commonly known as:  TUMS - dosed in mg elemental calcium  Chew 1 tablet by mouth as needed for heartburn.     DULERA 200-5 MCG/ACT Aero  Generic drug:  mometasone-formoterol  Inhale 2 puffs into the lungs 2 (two) times daily.     FLUoxetine 20 MG tablet  Commonly known as:  PROZAC  Take 20 mg by mouth daily.     levothyroxine  75 MCG tablet  Commonly known as:  SYNTHROID, LEVOTHROID  Take 1 tablet by mouth daily.     methocarbamol 500 MG tablet  Commonly known as:  ROBAXIN  Take 1 tablet (500 mg total) by mouth every 6 (six) hours as needed for muscle spasms (spasm).     multivitamin tablet  Take 1 tablet by mouth daily.     omeprazole 20 MG tablet  Commonly known as:  PRILOSEC OTC  Take 20 mg by mouth daily.     ondansetron 4 MG tablet  Commonly known as:  ZOFRAN  Take 1 tablet (4 mg total) by mouth every 8 (eight) hours as needed for nausea.     oxyCODONE-acetaminophen 5-325 MG per tablet  Commonly known as:  ROXICET  Take 1-2 tablets by mouth every 4 (four) hours as needed.     SYSTANE BALANCE 0.6 % Soln  Generic drug:  Propylene Glycol  Apply 1 drop to eye.        Diagnostic Studies: Dg Chest 2 View  05/08/2014   CLINICAL DATA:  Preoperative  respiratory evaluation prior to right knee arthroplasty for osteoarthritis. Former smoker with current history of COPD.  EXAM: CHEST  2 VIEW  COMPARISON:  05/30/2012, 10/08/2010, 12/08/2004.  FINDINGS: Cardiac silhouette normal in size, unchanged. Thoracic aorta mildly atherosclerotic. Hilar and mediastinal contours otherwise unremarkable. Scarring in the left lower lobe and lingula, unchanged. Lungs otherwise clear. No localized airspace consolidation. No pleural effusions. No pneumothorax. Normal pulmonary vascularity. Mild degenerative changes involving the thoracic spine. No significant interval change.  IMPRESSION: Stable scarring in the left lower lobe and lingula. No acute cardiopulmonary disease.   Electronically Signed   By: Evangeline Dakin M.D.   On: 05/08/2014 16:45    Disposition: 06-Home-Health Care Svc  DISCHARGE INSTRUCTIONS: Keep knee incision dry for 5 days post op then may wet while bathing. Therapy daily and goal full extension and greater than 90 degrees flexion. Call if fever or chills or increased drainage. Go to ER if acutely short of breath or call for ambulance. Return for follow up in 2 weeks. May full weight bear on the surgical leg unless told otherwise. Use knee immobilizer until able to straight leg raise off bed with knee stable. In house walking for first 2 weeks.        Follow-up Information   Follow up with Marybelle Killings, MD. Schedule an appointment as soon as possible for a visit in 2 weeks.   Specialty:  Orthopedic Surgery   Contact information:   [place] [address] [phone]        Signed: Epimenio Foot 05/21/2014, 3:11 PM

## 2014-06-12 ENCOUNTER — Ambulatory Visit: Payer: BC Managed Care – PPO | Admitting: Internal Medicine

## 2014-06-29 ENCOUNTER — Other Ambulatory Visit: Payer: Self-pay | Admitting: Internal Medicine

## 2014-08-01 ENCOUNTER — Ambulatory Visit: Payer: BC Managed Care – PPO | Admitting: Internal Medicine

## 2014-09-17 ENCOUNTER — Ambulatory Visit (INDEPENDENT_AMBULATORY_CARE_PROVIDER_SITE_OTHER): Payer: BLUE CROSS/BLUE SHIELD | Admitting: Internal Medicine

## 2014-09-17 ENCOUNTER — Encounter: Payer: Self-pay | Admitting: Internal Medicine

## 2014-09-17 VITALS — BP 120/76 | HR 79 | Ht 61.25 in | Wt 215.0 lb

## 2014-09-17 DIAGNOSIS — J449 Chronic obstructive pulmonary disease, unspecified: Secondary | ICD-10-CM

## 2014-09-17 DIAGNOSIS — J302 Other seasonal allergic rhinitis: Secondary | ICD-10-CM

## 2014-09-17 DIAGNOSIS — G4733 Obstructive sleep apnea (adult) (pediatric): Secondary | ICD-10-CM

## 2014-09-17 NOTE — Progress Notes (Signed)
08/19/12- 64 yo F former smoker referred by Dr. Wynetta Emery for questionable asthma.  Has SOB that can be with exertion or rest, wheezing, chest tightness, and nonprod cough - -- symptoms started several months ago.  Had flu like illness/ wheezing in spring of 2013 treated., treated with Advair and rescue inhaler. Now gradually worse wheezing and dyspnea. Gaining weight. No prior history of asthma, pneumonia or allergy. Overall easier dyspnea on exertion since spring 2013. Rescue inhaler has been some help, used about 4 times per week. Has been consistent with Advair twice daily. PFT had shown moderate obstructive airways disease with air trapping and response to bronchodilator in October 2013. Environment: House with no basement. Has cat. Mold in wall paper at work place after water leak.  Smoked for 33 years, 1-1/2 packs per day, stopping in 2003. Daughter with allergic asthma. Aware of heartburn and reflux symptoms. Shortness of breath at rest and with exertion. Nonproductive cough. Atypical chest pains. Sneezing and itching. She sleeps alone but is concerned that she wakes herself snoring, gets tired during the day, and might have sleep apnea.  PFT 06/08/12/ [facility]- FEV1 1.75/73%, FEV1/FVC 0.63, FEF 25-75% 1.10/48%. RV 154%, TLC 120%, DLCO 112%. Moderate obstruction with response to bronchodilator hyperinflation and air trapping  // on return- needs allergy assessment- mold??//  11/15/12- 64 yo F former smoker referred by Dr. Wynetta Emery for questionable asthma.  Has SOB that can be with exertion or rest, wheezing, chest tightness, and nonprod cough - -- symptoms started several months ago.  FOLLOWS FOR: review 6MW with patient; breathing is about the same as previous visit; Pt had high BP after test and c/o SOB as well. Eyes and nose water now with spring season. Like sample Dulera 100. Little change in shortness of breath, wheeze or nonproductive cough. No chest pain or palpitation. She has decided to go  forward with  a sleep study after her dentist also commented that sleep apnea seems likely. 6MWT- 11/15/12-96%, 95%, 97%, 447 m. BP at baseline 140/72 reached maximum 200/92 by in the past and was 180/86 after 2 minutes rest.normal oxygenation with exercise, but hypertension.  12/28/12- 64 yo F former smoker referred by Dr. Wynetta Emery for COPD/ asthma, Allergic rhinitis, OSA PCP Dr Earle Gell FOLLOWS FOR: SOB and wheezing at times-uses rescue inhaler for relief; review sleep study with patient. Breathing has been controlled. Blames pollen for increased need for rescue inhaler while continuing Dulera 100.Marland Kitchen She is curious about retrying Advair. NPSG 11/24/12- mild obstructive sleep apnea, AHI 11.4/ hr. Also noted nocturia x3 disturbed sleep.  02/13/13- 64 yo F former smoker referred by Dr. Wynetta Emery for COPD/ asthma, Allergic rhinitis, OSA PCP Dr Earle Gell FOLLOWS ACZ:YSAYT CPAP Auto/ Advanced every night anywhere from 4-8 hours(took chip from CPAP on Friday)  Stayed with Dulera, usually only once a day.  06/12/13- 64 yo F former smoker referred by Dr. Wynetta Emery for COPD/ asthma, Allergic rhinitis, OSA PCP Dr Earle Gell FOLLOWS FOR: wears CPAPAuto/ Advanced every night;  CPAP overblows on Auto. Thinks it affects her teeth. Using nasal pillows.  Hx allergic rhinitis. Old building. C/o worse eye irritation and nasal congestion/ blowing.  Download indicates fixed CPAP 11.   09/17/14-  64 yo F former smoker referred by Dr. Wynetta Emery for COPD/ asthma, Allergic rhinitis, OSA FOLLOWS FOR: Wears CPAP Auto 5-12 every night through [facility].  She is very happy with CPAP at current settings. Used it as dierected through TKR surgery last Fall. Rare need for rescue inhaler No  nasal symproms  ROS-see HPI Constitutional:   No-   weight loss, night sweats, fevers, chills, fatigue, lassitude. HEENT:   No-  headaches, difficulty swallowing, tooth/dental problems, sore throat,       No- sneezing, itching, ear ache,  nasal congestion, post nasal drip,  CV:  No- anginal  chest pain, orthopnea, PND, swelling in lower extremities, anasarca,  dizziness, palpitations Resp: No-   shortness of breath with exertion or at rest.              No-   productive cough,   non-productive cough,  No- coughing up of blood.              No-   change in color of mucus.  No- wheezing.   Skin: No-   rash or lesions. GI:  No-   +Heartburn,+ indigestion, abdominal pain, nausea, vomiting, GU:  MS:  No-   joint pain or swelling.   Neuro-     nothing unusual Psych:  No- change in mood or affect. + depression or anxiety.  No memory loss.  OBJ- Physical Exam General- Alert, Oriented, Affect-appropriate, Distress- none acute, obese Skin- rash-none, lesions- none, excoriation- none Lymphadenopathy- none Head- atraumatic            Eyes- Gross vision intact, PERRLA, conjunctivae and secretions clear            Ears- Hearing, canals-normal            Nose- clear, no-Septal dev, mucus, polyps, erosion, perforation             Throat- Mallampati III , mucosa clear , drainage- none, tonsils- atrophic Neck- flexible , trachea midline, no stridor , thyroid nl, carotid no bruit Chest - symmetrical excursion , unlabored           Heart/CV- RRR , no murmur , no gallop  , no rub, nl s1 s2                           - JVD- none , edema- none, stasis changes- none, varices- none           Lung- clear to P&A, wheeze- none, cough- none , dullness-none, rub- none           Chest wall-  Abd- Br/ Gen/ Rectal- Not done, not indicated Extrem- cyanosis- none, clubbing, none, atrophy- none, strength- nl Neuro- grossly intact to observation

## 2014-09-17 NOTE — Assessment & Plan Note (Signed)
Well controlled w/o complication

## 2014-09-17 NOTE — Assessment & Plan Note (Signed)
Comfortable for now, pending spring pollen season

## 2014-09-17 NOTE — Assessment & Plan Note (Signed)
She is very pleased, using CPAP all night, every night with good control Plan- leave on autoPAP 5-12

## 2014-09-17 NOTE — Patient Instructions (Signed)
We can continue CPAP auto 5-12 Advanced  Please call if we can help

## 2014-11-26 ENCOUNTER — Other Ambulatory Visit: Payer: Self-pay | Admitting: Internal Medicine

## 2014-11-28 ENCOUNTER — Telehealth: Payer: Self-pay | Admitting: Internal Medicine

## 2014-11-28 MED ORDER — ALBUTEROL SULFATE HFA 108 (90 BASE) MCG/ACT IN AERS: 2.0000 | INHALATION_SPRAY | RESPIRATORY_TRACT | Status: DC | PRN

## 2014-11-28 NOTE — Telephone Encounter (Signed)
Requesting refill of Ventolin HFA This has been refilled to [facility].  Nothing further needed.

## 2015-06-25 ENCOUNTER — Encounter (HOSPITAL_COMMUNITY): Payer: Self-pay

## 2015-06-25 ENCOUNTER — Emergency Department (HOSPITAL_COMMUNITY): Payer: BLUE CROSS/BLUE SHIELD

## 2015-06-25 ENCOUNTER — Emergency Department (HOSPITAL_COMMUNITY)
Admission: EM | Admit: 2015-06-25 | Discharge: 2015-06-25 | Disposition: A | Discharge status: Home / Self Care | Payer: BLUE CROSS/BLUE SHIELD | Attending: Emergency Medicine | Admitting: Emergency Medicine

## 2015-06-25 DIAGNOSIS — F329 Major depressive disorder, single episode, unspecified: Secondary | ICD-10-CM | POA: Insufficient documentation

## 2015-06-25 DIAGNOSIS — Z8742 Personal history of other diseases of the female genital tract: Secondary | ICD-10-CM | POA: Diagnosis not present

## 2015-06-25 DIAGNOSIS — K219 Gastro-esophageal reflux disease without esophagitis: Secondary | ICD-10-CM | POA: Insufficient documentation

## 2015-06-25 DIAGNOSIS — R011 Cardiac murmur, unspecified: Secondary | ICD-10-CM | POA: Insufficient documentation

## 2015-06-25 DIAGNOSIS — Z87448 Personal history of other diseases of urinary system: Secondary | ICD-10-CM | POA: Diagnosis not present

## 2015-06-25 DIAGNOSIS — Z79899 Other long term (current) drug therapy: Secondary | ICD-10-CM | POA: Diagnosis not present

## 2015-06-25 DIAGNOSIS — R0789 Other chest pain: Secondary | ICD-10-CM | POA: Insufficient documentation

## 2015-06-25 DIAGNOSIS — M1990 Unspecified osteoarthritis, unspecified site: Secondary | ICD-10-CM | POA: Insufficient documentation

## 2015-06-25 DIAGNOSIS — Z86018 Personal history of other benign neoplasm: Secondary | ICD-10-CM | POA: Diagnosis not present

## 2015-06-25 DIAGNOSIS — E039 Hypothyroidism, unspecified: Secondary | ICD-10-CM | POA: Diagnosis not present

## 2015-06-25 DIAGNOSIS — Z8669 Personal history of other diseases of the nervous system and sense organs: Secondary | ICD-10-CM | POA: Diagnosis not present

## 2015-06-25 DIAGNOSIS — J449 Chronic obstructive pulmonary disease, unspecified: Secondary | ICD-10-CM | POA: Insufficient documentation

## 2015-06-25 DIAGNOSIS — Z87891 Personal history of nicotine dependence: Secondary | ICD-10-CM | POA: Insufficient documentation

## 2015-06-25 DIAGNOSIS — R079 Chest pain, unspecified: Secondary | ICD-10-CM | POA: Diagnosis present

## 2015-06-25 LAB — CBC
HEMATOCRIT: 45.4 % (ref 36.0–46.0)
Hemoglobin: 14.8 g/dL (ref 12.0–15.0)
MCH: 29.6 pg (ref 26.0–34.0)
MCHC: 32.6 g/dL (ref 30.0–36.0)
MCV: 90.8 fL (ref 78.0–100.0)
PLATELETS: 281 10*3/uL (ref 150–400)
RBC: 5 MIL/uL (ref 3.87–5.11)
RDW: 14 % (ref 11.5–15.5)
WBC: 4.7 10*3/uL (ref 4.0–10.5)

## 2015-06-25 LAB — I-STAT TROPONIN, ED
Troponin i, poc: 0 ng/mL (ref 0.00–0.08)
Troponin i, poc: 0 ng/mL (ref 0.00–0.08)

## 2015-06-25 LAB — D-DIMER, QUANTITATIVE (NOT AT ARMC): D-Dimer, Quant: 0.27 ug/mL-FEU (ref 0.00–0.48)

## 2015-06-25 LAB — BASIC METABOLIC PANEL
Anion gap: 9 (ref 5–15)
BUN: 5 mg/dL — AB (ref 6–20)
CALCIUM: 9.8 mg/dL (ref 8.9–10.3)
CO2: 25 mmol/L (ref 22–32)
CREATININE: 0.71 mg/dL (ref 0.44–1.00)
Chloride: 101 mmol/L (ref 101–111)
GFR calc Af Amer: >60 mL/min (ref >60)
Glucose, Bld: 137 mg/dL — ABNORMAL HIGH (ref 65–99)
POTASSIUM: 4.2 mmol/L (ref 3.5–5.1)
SODIUM: 135 mmol/L (ref 135–145)

## 2015-06-25 MED ORDER — METHOCARBAMOL 500 MG PO TABS: 1000.0000 mg | ORAL_TABLET | Freq: Three times a day (TID) | ORAL | Status: DC | PRN

## 2015-06-25 MED ORDER — NITROGLYCERIN 0.4 MG SL SUBL
0.4000 mg | SUBLINGUAL_TABLET | SUBLINGUAL | Status: DC | PRN
  Administered 2015-06-25: 0.4 mg via SUBLINGUAL
  Filled 2015-06-25: qty 1

## 2015-06-25 MED ORDER — ASPIRIN 81 MG PO CHEW
324.0000 mg | CHEWABLE_TABLET | Freq: Once | ORAL | Status: AC
  Administered 2015-06-25: 324 mg via ORAL
  Filled 2015-06-25: qty 4

## 2015-06-25 NOTE — ED Notes (Signed)
Pt reports generalized dull chest pain onset one hour ago today that radiates to jaw and shoulders. Hx of chest pain and went to PCP/NP yesterday and had EKG done and sent home with sore throat and cold medication, azithromycin and prednisone.

## 2015-06-25 NOTE — ED Provider Notes (Signed)
CSN: [id number]     Arrival date & time 06/25/15  1710 History   First MD Initiated Contact with Patient 06/25/15 1914     Chief Complaint  Patient presents with  . Chest Pain     (Consider location/radiation/quality/duration/timing/severity/associated sxs/prior Treatment) HPI Patient presents with chest pain starting at 3:30-4 PM today. Patient describes the pain as a sharp central pain that radiates to her jaw and shoulders. Patient admits to increase cough recently and being started as on azithromycin and prednisone by her primary physician. Patient states she hadn't episode of similar pain on Sunday that resolved spontaneously. Patient has no history of coronary artery disease. Was evaluated for chest pain several years ago by Dr. Tamala Julian and had a normal stress test. Patient denies any current shortness of breath. Chest pain has improved but is still present. Ranks pain 3 of 10. Denies any lower extremity swelling or pain. No extended recent travel Past Medical History  Diagnosis Date  . Fatty liver   . GERD (gastroesophageal reflux disease)   . Graves disease   . Hypothyroidism     post radioactive iodine to treat Graves' disease  . Depression   . Postmenopausal   . Carpal tunnel syndrome   . Overactive bladder   . Hypercholesteremia   . Vulvar dysplasia   . Adenomatous polyp   . Liver hemangioma   . PONV (postoperative nausea and vomiting)   . Heart murmur     as a child  . Sleep apnea   . Arthritis   . COPD (chronic obstructive pulmonary disease) ([place])   . Shortness of breath    Past Surgical History  Procedure Laterality Date  . Vesicovaginal fistula closure w/ tah  1998  . Breast reduction surgery  1981  . Right shoulder surgery  2009  . Abdominal hysterectomy    . Knee arthroscopy Right 2014  . Tonsillectomy  1985  . Colonoscopy w/ biopsies and polypectomy      benign  . Tubal ligation    . Total knee arthroplasty Right 05/16/2014    DR Lorin Mercy  . Knee  arthroplasty Right 05/16/2014    Procedure: COMPUTER ASSISTED RIGHT TOTAL KNEE ARTHROPLASTY;  Surgeon: Marybelle Killings, MD;  Location: [place];  Service: Orthopedics;  Laterality: Right;   Family History  Problem Relation Age of Onset  . Heart attack Father   . Lung cancer Mother   . Asthma Daughter   . Allergies Daughter    Social History  Substance Use Topics  . Smoking status: Former Smoker -- 1.50 packs/day for 33 years    Types: Cigarettes    Quit date: 08/24/2001  . Smokeless tobacco: Never Used  . Alcohol Use: Yes     Comment: wine a couple times a week   OB History    No data available     Review of Systems  Constitutional: Negative for fever and chills.  HENT: Positive for sore throat.   Respiratory: Positive for cough. Negative for shortness of breath and wheezing.   Cardiovascular: Positive for chest pain. Negative for palpitations and leg swelling.  Gastrointestinal: Negative for nausea, vomiting, abdominal pain and diarrhea.  Musculoskeletal: Negative for back pain, arthralgias, neck pain and neck stiffness.  Skin: Negative for rash and wound.  Neurological: Negative for dizziness, weakness, light-headedness, numbness and headaches.  All other systems reviewed and are negative.     Allergies  Hydrocodone; Breo ellipta; Codeine; Oxycodone hcl; and Strawberry extract  Home Medications   Prior  to Admission medications   Medication Sig Start Date End Date Taking? Authorizing Provider  ADVAIR DISKUS 250-50 MCG/DOSE AEPB INHALE 1 PUFF INTO THE LUNGS 2 (TWO) TIMES DAILY. 06/29/14  Yes Deneise Lever, MD  albuterol (PROVENTIL HFA;VENTOLIN HFA) 108 (90 BASE) MCG/ACT inhaler Inhale 2 puffs into the lungs every 4 (four) hours as needed. Patient taking differently: Inhale 2 puffs into the lungs every 4 (four) hours as needed for shortness of breath.  11/28/14  Yes Deneise Lever, MD  azithromycin (ZITHROMAX) 250 MG tablet Take 250 mg by mouth daily. Started medication  06-24-15   Yes Historical Provider, MD  calcium carbonate (TUMS - DOSED IN MG ELEMENTAL CALCIUM) 500 MG chewable tablet Chew 1 tablet by mouth as needed for heartburn.   Yes Historical Provider, MD  FLUoxetine (PROZAC) 40 MG capsule Take 1 capsule by mouth daily. 09/02/14  Yes Historical Provider, MD  levothyroxine (SYNTHROID, LEVOTHROID) 75 MCG tablet Take 1 tablet by mouth daily.   Yes Historical Provider, MD  predniSONE (STERAPRED UNI-PAK 21 TAB) 5 MG (21) TBPK tablet Take 5 mg by mouth daily. Started medication on 06-25-15. See packet for sig information   Yes Historical Provider, MD  methocarbamol (ROBAXIN) 500 MG tablet Take 2 tablets (1,000 mg total) by mouth every 8 (eight) hours as needed for muscle spasms. 06/25/15   Julianne Rice, MD  Propylene Glycol (SYSTANE BALANCE) 0.6 % SOLN Apply 1 drop to eye.    Historical Provider, MD   BP 135/59 mmHg  Pulse 52  Temp(Src) 98.5 F (36.9 C) (Oral)  Resp 9  Ht 5\' 2"  (1.575 m)  Wt 234 lb (106.142 kg)  BMI 42.79 kg/m2  SpO2 94% Physical Exam  Constitutional: She is oriented to person, place, and time. She appears well-developed and well-nourished. No distress.  HENT:  Head: Normocephalic and atraumatic.  Mouth/Throat: Oropharynx is clear and moist.  Eyes: EOM are normal. Pupils are equal, round, and reactive to light.  Neck: Normal range of motion. Neck supple.  Cardiovascular: Normal rate and regular rhythm.  Exam reveals no gallop.   No murmur heard. Pulmonary/Chest: Effort normal and breath sounds normal. No respiratory distress. She has no wheezes. She has no rales. She exhibits tenderness (chest tenderness reproduced with palpation around the parasternal area. There is no crepitance or deformity.).  Abdominal: Soft. Bowel sounds are normal. She exhibits no distension and no mass. There is no tenderness. There is no rebound and no guarding.  Musculoskeletal: Normal range of motion. She exhibits no edema or tenderness.  No lower  extremity swelling or pain.  Neurological: She is alert and oriented to person, place, and time.  Skin: Skin is warm and dry. No rash noted. No erythema.  Psychiatric: She has a normal mood and affect. Her behavior is normal.  Nursing note and vitals reviewed.   ED Course  Procedures (including critical care time) Labs Review Labs Reviewed  BASIC METABOLIC PANEL - Abnormal; Notable for the following:    Glucose, Bld 137 (*)    BUN 5 (*)    All other components within normal limits  CBC  D-DIMER, QUANTITATIVE (NOT AT [facility])  Randolm Idol, ED  Randolm Idol, ED    Imaging Review Dg Chest 2 View  06/25/2015  CLINICAL DATA:  64 year old female with a history of chest pain EXAM: CHEST - 2 VIEW COMPARISON:  05/08/2014, 05/30/2012 FINDINGS: Cardiomediastinal silhouette projects within normal limits in size and contour. Atherosclerotic calcifications of the aortic arch. No evidence of  pulmonary vascular congestion. Chronic interstitial opacities bilateral lungs, more pronounced at the inferior aspects. Appearance unchanged from comparison. No confluent airspace disease, pneumothorax, or pleural effusion. No displaced fracture. Unremarkable appearance of the upper abdomen. IMPRESSION: Chronic lung changes with no evidence of superimposed acute cardiopulmonary disease. Atherosclerosis Signed, Dulcy Fanny. Earleen Newport, DO [facility] Electronically Signed   By: Corrie Mckusick D.O.   On: 06/25/2015 18:04   I have personally reviewed and evaluated these images and lab results as part of my medical decision-making.   EKG Interpretation   Date/Time:  Tuesday June 25 2015 17:19:11 EDT Ventricular Rate:  65 PR Interval:  136 QRS Duration: 76 QT Interval:  402 QTC Calculation: 418 R Axis:   62 Text Interpretation:  Normal sinus rhythm Cannot rule out Anterior infarct  , age undetermined Abnormal ECG Confirmed by Lita Mains  MD, Adalida Garver  ([id number])  on 06/25/2015 7:20:04 PM      MDM   Final diagnoses:  Anterior chest wall pain    Patient with reproducible chest wall pain. Chest x-ray without any acute changes. EKG with normal sinus rhythm and no evidence of ischemia. Patient has a normal d-dimer. She has normal troponin 2. States she is feeling better though pain is still reproduced with palpation. Consistent with chest wall pain. Low suspicion for coronary artery disease. We'll treat symptomatically and have the patient follow-up with her primary physician. Patient understands the need to return immediately for worsening pain, difficulty breathing, fever or for any concerns.    Julianne Rice, MD 06/25/15 2218

## 2015-06-25 NOTE — Discharge Instructions (Signed)

## 2015-07-02 ENCOUNTER — Other Ambulatory Visit: Payer: Self-pay | Admitting: Internal Medicine

## 2015-08-25 HISTORY — PX: CATARACT EXTRACTION: SUR2

## 2015-09-18 ENCOUNTER — Ambulatory Visit: Payer: BLUE CROSS/BLUE SHIELD | Admitting: Internal Medicine

## 2015-09-30 ENCOUNTER — Other Ambulatory Visit: Payer: Self-pay | Admitting: Internal Medicine

## 2015-10-10 ENCOUNTER — Other Ambulatory Visit: Payer: Self-pay | Admitting: Gastroenterology

## 2015-10-10 ENCOUNTER — Encounter (HOSPITAL_COMMUNITY): Payer: Self-pay | Admitting: *Deleted

## 2015-10-14 ENCOUNTER — Encounter (HOSPITAL_COMMUNITY)
Admission: RE | Disposition: A | Discharge status: Home / Self Care | Payer: Self-pay | Source: Ambulatory Visit | Attending: Gastroenterology

## 2015-10-14 ENCOUNTER — Ambulatory Visit (HOSPITAL_COMMUNITY)
Admission: RE | Admit: 2015-10-14 | Discharge: 2015-10-14 | Disposition: A | Discharge status: Home / Self Care | Payer: BLUE CROSS/BLUE SHIELD | Source: Ambulatory Visit | Attending: Gastroenterology | Admitting: Gastroenterology

## 2015-10-14 ENCOUNTER — Ambulatory Visit (HOSPITAL_COMMUNITY): Payer: BLUE CROSS/BLUE SHIELD | Admitting: Certified Registered"

## 2015-10-14 ENCOUNTER — Encounter (HOSPITAL_COMMUNITY): Payer: Self-pay

## 2015-10-14 DIAGNOSIS — Z96651 Presence of right artificial knee joint: Secondary | ICD-10-CM | POA: Insufficient documentation

## 2015-10-14 DIAGNOSIS — J449 Chronic obstructive pulmonary disease, unspecified: Secondary | ICD-10-CM | POA: Diagnosis not present

## 2015-10-14 DIAGNOSIS — E7800 Pure hypercholesterolemia, unspecified: Secondary | ICD-10-CM | POA: Diagnosis not present

## 2015-10-14 DIAGNOSIS — K219 Gastro-esophageal reflux disease without esophagitis: Secondary | ICD-10-CM | POA: Insufficient documentation

## 2015-10-14 DIAGNOSIS — Z87891 Personal history of nicotine dependence: Secondary | ICD-10-CM | POA: Insufficient documentation

## 2015-10-14 DIAGNOSIS — J45909 Unspecified asthma, uncomplicated: Secondary | ICD-10-CM | POA: Diagnosis not present

## 2015-10-14 DIAGNOSIS — Z1211 Encounter for screening for malignant neoplasm of colon: Secondary | ICD-10-CM | POA: Insufficient documentation

## 2015-10-14 DIAGNOSIS — Z90710 Acquired absence of both cervix and uterus: Secondary | ICD-10-CM | POA: Insufficient documentation

## 2015-10-14 DIAGNOSIS — Z6841 Body Mass Index (BMI) 40.0 and over, adult: Secondary | ICD-10-CM | POA: Diagnosis not present

## 2015-10-14 DIAGNOSIS — G4733 Obstructive sleep apnea (adult) (pediatric): Secondary | ICD-10-CM | POA: Insufficient documentation

## 2015-10-14 DIAGNOSIS — E890 Postprocedural hypothyroidism: Secondary | ICD-10-CM | POA: Insufficient documentation

## 2015-10-14 DIAGNOSIS — K5730 Diverticulosis of large intestine without perforation or abscess without bleeding: Secondary | ICD-10-CM | POA: Insufficient documentation

## 2015-10-14 DIAGNOSIS — D122 Benign neoplasm of ascending colon: Secondary | ICD-10-CM | POA: Diagnosis not present

## 2015-10-14 DIAGNOSIS — M1990 Unspecified osteoarthritis, unspecified site: Secondary | ICD-10-CM | POA: Insufficient documentation

## 2015-10-14 DIAGNOSIS — Z86010 Personal history of colonic polyps: Secondary | ICD-10-CM | POA: Insufficient documentation

## 2015-10-14 HISTORY — PX: COLONOSCOPY WITH PROPOFOL: SHX5780

## 2015-10-14 SURGERY — COLONOSCOPY WITH PROPOFOL
Anesthesia: Monitor Anesthesia Care

## 2015-10-14 MED ORDER — SODIUM CHLORIDE 0.9 % IV SOLN: INTRAVENOUS | Status: DC

## 2015-10-14 MED ORDER — LIDOCAINE HCL (CARDIAC) 20 MG/ML IV SOLN
INTRAVENOUS | Status: AC
  Filled 2015-10-14: qty 5

## 2015-10-14 MED ORDER — MIDAZOLAM HCL 5 MG/5ML IJ SOLN
INTRAMUSCULAR | Status: DC | PRN
  Administered 2015-10-14: 2 mg via INTRAVENOUS

## 2015-10-14 MED ORDER — ONDANSETRON HCL 4 MG/2ML IJ SOLN
INTRAMUSCULAR | Status: AC
  Filled 2015-10-14: qty 2

## 2015-10-14 MED ORDER — MIDAZOLAM HCL 2 MG/2ML IJ SOLN
INTRAMUSCULAR | Status: AC
Start: 2015-10-14 — End: 2015-10-14
  Filled 2015-10-14: qty 2

## 2015-10-14 MED ORDER — PROPOFOL 10 MG/ML IV BOLUS
INTRAVENOUS | Status: DC | PRN
  Administered 2015-10-14: 50 mg via INTRAVENOUS

## 2015-10-14 MED ORDER — ONDANSETRON HCL 4 MG/2ML IJ SOLN
INTRAMUSCULAR | Status: DC | PRN
  Administered 2015-10-14 (×2): 2 mg via INTRAVENOUS

## 2015-10-14 MED ORDER — PROPOFOL 10 MG/ML IV BOLUS
INTRAVENOUS | Status: AC
  Filled 2015-10-14: qty 20

## 2015-10-14 MED ORDER — LACTATED RINGERS IV SOLN
INTRAVENOUS | Status: DC
  Administered 2015-10-14: 14:00:00 via INTRAVENOUS

## 2015-10-14 MED ORDER — LIDOCAINE HCL (CARDIAC) 20 MG/ML IV SOLN
INTRAVENOUS | Status: DC | PRN
  Administered 2015-10-14: 100 mg via INTRAVENOUS

## 2015-10-14 MED ORDER — PROPOFOL 500 MG/50ML IV EMUL
INTRAVENOUS | Status: DC | PRN
  Administered 2015-10-14: 150 ug/kg/min via INTRAVENOUS

## 2015-10-14 SURGICAL SUPPLY — 22 items

## 2015-10-14 NOTE — Discharge Instructions (Signed)

## 2015-10-14 NOTE — H&P (Signed)
  Procedure: Surveillance colonoscopy. 04/15/2005 baseline screening colonoscopy was performed with removal of a 3 mm descending colon adenomatous polyp. 04/22/2010 surveillance colonoscopy was normal. Small hyperplastic polyps were removed.  History: The patient is a 65 year old female born 1950/09/30. She is scheduled to undergo a surveillance colonoscopy today.  Past medical history: Chronic obstructive pulmonary disease. Obstructive sleep apnea syndrome. Depression with anxiety. Hypothyroidism following radioactive iodine treatment of Graves' disease. Liver hemangioma. Fatty liver. Hypercholesterolemia. History of adenomatous colon polyp removed colonoscopically in the past. Hysterectomy. Breast reduction surgery. Right shoulder surgery. Right total knee replacement surgery gastroesophageal reflux. Overactive urinary bladder. Carpal tunnel syndrome.  Medication allergies: Codeine causes vomiting. Oxycodone causes vomiting. Lipitor caused elevated liver transaminases.  Exam: The patient is alert and lying comfortably on the endoscopy stretcher. Abdomen is soft and nontender to palpation. Lungs are clear to auscultation. Cardiac exam reveals a regular rhythm.  Plan: Proceed with surveillance colonoscopy

## 2015-10-14 NOTE — Transfer of Care (Signed)
Immediate Anesthesia Transfer of Care Note  Patient: Jennifer Barker  Procedure(s) Performed: Procedure(s): COLONOSCOPY WITH PROPOFOL (N/A)  Patient Location: PACU  Anesthesia Type:MAC  Level of Consciousness:  sedated, patient cooperative and responds to stimulation  Airway & Oxygen Therapy:Patient Spontanous Breathing and Patient connected to face mask oxgen  Post-op Assessment:  Report given to PACU RN and Post -op Vital signs reviewed and stable  Post vital signs:  Reviewed and stable  Last Vitals:  Filed Vitals:   10/14/15 1231  BP: 142/73  Pulse: 66  Temp: 36.9 C  Resp: 10    Complications: No apparent anesthesia complications

## 2015-10-14 NOTE — Anesthesia Preprocedure Evaluation (Signed)
Anesthesia Evaluation  Patient identified by MRN, date of birth, ID band Patient awake    Reviewed: Allergy & Precautions, NPO status , Patient's Chart, lab work & pertinent test results  History of Anesthesia Complications (+) PONV  Airway Mallampati: II   Neck ROM: full    Dental   Pulmonary shortness of breath, asthma , sleep apnea , COPD, former smoker,    breath sounds clear to auscultation       Cardiovascular negative cardio ROS   Rhythm:regular Rate:Normal     Neuro/Psych Depression  Neuromuscular disease    GI/Hepatic GERD  ,  Endo/Other  Hypothyroidism Hyperthyroidism Morbid obesity  Renal/GU      Musculoskeletal  (+) Arthritis ,   Abdominal   Peds  Hematology   Anesthesia Other Findings   Reproductive/Obstetrics                             Anesthesia Physical Anesthesia Plan  ASA: III  Anesthesia Plan: MAC   Post-op Pain Management:    Induction: Intravenous  Airway Management Planned: Simple Face Mask  Additional Equipment:   Intra-op Plan:   Post-operative Plan:   Informed Consent: I have reviewed the patients History and Physical, chart, labs and discussed the procedure including the risks, benefits and alternatives for the proposed anesthesia with the patient or authorized representative who has indicated his/her understanding and acceptance.     Plan Discussed with: CRNA, Anesthesiologist and Surgeon  Anesthesia Plan Comments:         Anesthesia Quick Evaluation

## 2015-10-14 NOTE — Op Note (Signed)
Procedure: Surveillance colonoscopy. 04/15/2005 baseline screening colonoscopy was performed with removal of a 3 mm descending colon adenomatous polyp. 04/22/2010 normal surveillance colonoscopy was performed; four small hyperplastic polyps were removed.  Endoscopist: Earle Gell  Premedication: Propofol administered by anesthesia  Procedure: The patient was placed in the left lateral decubitus position. Anal inspection and digital rectal exam were normal. The Pentax pediatric colonoscope was introduced into the rectum and advanced to the cecum. A normal-appearing appendiceal orifice and ileocecal valve were identified. Colonic preparation for the exam today was good. Withdrawal time was 14 minutes  Rectum. Normal. Retroflex view of the distal rectum was normal  Sigmoid colon and descending colon. Colonic diverticulosis  Splenic flexure. Normal  Transverse colon. Normal  Hepatic flexure. Normal  Ascending colon. Two 5 mm sessile polyps were removed from the proximal ascending colon with the cold snare  Cecum and ileocecal valve. Normal  Assessment: Two small sessile polyps were removed from the proximal ascending colon. Otherwise normal colonoscopy  Recommendation: I will review the polyp pathology to determine when the patient should undergo a repeat colonoscopy

## 2015-10-15 ENCOUNTER — Encounter (HOSPITAL_COMMUNITY): Payer: Self-pay | Admitting: Gastroenterology

## 2015-10-15 NOTE — Anesthesia Postprocedure Evaluation (Signed)
Anesthesia Post Note  Patient: Jennifer Barker  Procedure(s) Performed: Procedure(s) (LRB): COLONOSCOPY WITH PROPOFOL (N/A)  Patient location during evaluation: PACU Anesthesia Type: MAC Level of consciousness: awake and alert Pain management: pain level controlled Vital Signs Assessment: post-procedure vital signs reviewed and stable Respiratory status: spontaneous breathing, nonlabored ventilation, respiratory function stable and patient connected to nasal cannula oxygen Cardiovascular status: stable and blood pressure returned to baseline Anesthetic complications: no    Last Vitals:  Filed Vitals:   10/14/15 1420 10/14/15 1430  BP: 126/64 144/95  Pulse: 75 71  Temp:    Resp: 17 18    Last Pain: There were no vitals filed for this visit.               [place]

## 2015-10-22 ENCOUNTER — Ambulatory Visit: Payer: BLUE CROSS/BLUE SHIELD | Admitting: Internal Medicine

## 2016-07-07 ENCOUNTER — Encounter: Payer: Self-pay | Admitting: *Deleted

## 2016-07-08 ENCOUNTER — Ambulatory Visit (INDEPENDENT_AMBULATORY_CARE_PROVIDER_SITE_OTHER): Payer: BLUE CROSS/BLUE SHIELD | Admitting: Diagnostic Neuroimaging

## 2016-07-08 ENCOUNTER — Encounter: Payer: Self-pay | Admitting: Diagnostic Neuroimaging

## 2016-07-08 VITALS — BP 146/88 | HR 82 | Ht 62.0 in | Wt 237.8 lb

## 2016-07-08 DIAGNOSIS — R413 Other amnesia: Secondary | ICD-10-CM | POA: Diagnosis not present

## 2016-07-08 NOTE — Patient Instructions (Signed)
Thank you for coming to see [place] at [facility]. I hope we have been able to provide you high quality care today.  You may receive a patient satisfaction survey over the next few weeks. We would appreciate your feedback and comments so that we may continue to improve ourselves and the health of our patients.  - I will check MRI brain and B12 level   ~~~~~~~~~~~~~~~~~~~~~~~~~~~~~~~~~~~~~~~~~~~~~~~~~~~~~~~~~~~~~~~~~  DR. Owens Hara'S GUIDE TO HAPPY AND HEALTHY LIVING These are some of my general health and wellness recommendations. Some of them may apply to you better than others. Please use common sense as you try these suggestions and feel free to ask me any questions.   ACTIVITY/FITNESS Mental, social, emotional and physical stimulation are very important for brain and body health. Try learning a new activity (arts, music, language, sports, games).  Keep moving your body to the best of your abilities. You can do this at home, inside or outside, the park, community center, gym or anywhere you like. Consider a physical therapist or personal trainer to get started. Consider the app Sworkit. Fitness trackers such as smart-watches, smart-phones or Fitbits can help as well.   NUTRITION Eat more plants: colorful vegetables, nuts, seeds and berries.  Eat less sugar, salt, preservatives and processed foods.  Avoid toxins such as cigarettes and alcohol.  Drink water when you are thirsty. Warm water with a slice of lemon is an excellent morning drink to start the day.  Consider these websites for more information The Nutrition Source (https://www.henry-hernandez.biz/) Precision Nutrition (WindowBlog.ch)   RELAXATION Consider practicing mindfulness meditation or other relaxation techniques such as deep breathing, prayer, yoga, tai chi, massage. See website mindful.org or the apps Headspace or Calm to help get started.   SLEEP Try  to get at least 7-8+ hours sleep per day. Regular exercise and reduced caffeine will help you sleep better. Practice good sleep hygeine techniques. See website sleep.org for more information.   PLANNING Prepare estate planning, living will, healthcare POA documents. Sometimes this is best planned with the help of an attorney. Theconversationproject.org and agingwithdignity.org are excellent resources.

## 2016-07-08 NOTE — Progress Notes (Signed)
[facility]  PATIENT: Jennifer Barker DOB: [birth year]  REFERRING CLINICIAN: H Stoneking HISTORY FROM: patient  REASON FOR VISIT: new consult    HISTORICAL  CHIEF COMPLAINT:  Chief Complaint  Patient presents with  . Memory Loss    rm 6, New Pt, "memory loss > 1 year"  MMSE 30  . Expressive aphasia    HISTORY OF PRESENT ILLNESS:   65 year old right-handed female here for evaluation of memory loss. Patient has history of asthma and Graves' disease. For past 1 year she has had increasing short-term memory, cognitive difficulty and word finding difficulties. She has missed appointments due to forgetting. She is having more trouble staying organized and problem solving. In fact patient had to leave her last job due to these cognitive difficulties. Patient is planning to start a new job that will have less cognitive demands. Patient works as a Engineer, maintenance (IT).  Patient's father had Alzheimer's disease and patient is concerned about similar diagnosis. Patient has had depression and stress issues for many years, was previously on Prozac for a number of years, and now on Wellbutrin for the past one year. Patient also has obstructive sleep apnea and is on CPAP for the past 2 years.    REVIEW OF SYSTEMS: Full 14 system review of systems performed and negative with exception of: Memory loss confusion depression allergies incontinence 40 pound weight gain over the past 5 years.  ALLERGIES: Allergies  Allergen Reactions  . Hydrocodone Nausea And Vomiting  . Breo Ellipta [Fluticasone Furoate-Vilanterol]     Light headed; dizzy spells, nasuea  . Codeine     vomiting  . Oxycodone Hcl     vomiting  . Strawberry Extract Hives    HOME MEDICATIONS: Outpatient Medications Prior to Visit  Medication Sig Dispense Refill  . ADVAIR DISKUS 250-50 MCG/DOSE AEPB INHALE 1 PUFF BY MOUTH 2 TIMES A DAY 60 each 2  . albuterol (PROVENTIL HFA;VENTOLIN HFA) 108 (90 BASE) MCG/ACT inhaler Inhale 2  puffs into the lungs every 4 (four) hours as needed. (Patient taking differently: Inhale 2 puffs into the lungs every 4 (four) hours as needed for shortness of breath. ) 1 Inhaler 6  . buPROPion (WELLBUTRIN SR) 150 MG 12 hr tablet Take 150 mg by mouth daily.  0  . calcium carbonate (TUMS - DOSED IN MG ELEMENTAL CALCIUM) 500 MG chewable tablet Chew 1 tablet by mouth as needed for heartburn.    . levothyroxine (SYNTHROID, LEVOTHROID) 100 MCG tablet Take 100 mcg by mouth daily before breakfast.    . FLUoxetine (PROZAC) 20 MG tablet Take 20 mg by mouth daily.  3  . predniSONE (STERAPRED UNI-PAK 21 TAB) 5 MG (21) TBPK tablet Take 5 mg by mouth daily. Started medication on 06-25-15. See packet for sig information    . Propylene Glycol (SYSTANE BALANCE) 0.6 % SOLN Apply 1 drop to eye daily as needed (dryness).      No facility-administered medications prior to visit.     PAST MEDICAL HISTORY: Past Medical History:  Diagnosis Date  . Adenomatous polyp   . Arthritis   . Carpal tunnel syndrome   . COPD (chronic obstructive pulmonary disease) ([place])   . Depression   . Fatty liver   . GERD (gastroesophageal reflux disease)    not a problem now.  Berenice Primas disease    tx. radioactive Iodine many years ago.  Marland Kitchen Heart murmur    as a child  . Hypercholesteremia   . Hypothyroidism  post radioactive iodine to treat Graves' disease  . Liver hemangioma   . Overactive bladder   . PONV (postoperative nausea and vomiting)   . Postmenopausal   . Shortness of breath   . Sleep apnea    cpap use- not using in 2 weeks "not functioning well at this time"  . Vulvar dysplasia     PAST SURGICAL HISTORY: Past Surgical History:  Procedure Laterality Date  . ABDOMINAL HYSTERECTOMY  1998  . BREAST REDUCTION SURGERY  1981  . CARPAL TUNNEL RELEASE Left   . CATARACT EXTRACTION  2017  . COLONOSCOPY W/ BIOPSIES AND POLYPECTOMY     benign  . COLONOSCOPY WITH PROPOFOL N/A 10/14/2015   Procedure: COLONOSCOPY WITH  PROPOFOL;  Surgeon: Garlan Fair, MD;  Location: [facility];  Service: Endoscopy;  Laterality: N/A;  . KNEE ARTHROPLASTY Right 05/16/2014   Procedure: COMPUTER ASSISTED RIGHT TOTAL KNEE ARTHROPLASTY;  Surgeon: Marybelle Killings, MD;  Location: [place];  Service: Orthopedics;  Laterality: Right;  . KNEE ARTHROSCOPY Right 2014  . right shoulder surgery  2009  . TONSILLECTOMY  1985  . TOTAL KNEE ARTHROPLASTY Right 05/16/2014   DR Lorin Mercy  . TUBAL LIGATION    . VESICOVAGINAL FISTULA CLOSURE W/ TAH  1998    FAMILY HISTORY: Family History  Problem Relation Age of Onset  . Heart attack Father   . Alzheimer's disease Father   . Lung cancer Mother   . Asthma Daughter   . Allergies Daughter     SOCIAL HISTORY:  Social History   Social History  . Marital status: Divorced    Spouse name: N/A  . Number of children: 1  . Years of education: N/A   Occupational History  . Accountant    Social History Main Topics  . Smoking status: Former Smoker    Packs/day: 0.50    Years: 33.00    Types: Cigarettes    Quit date: 08/24/2001  . Smokeless tobacco: Never Used  . Alcohol use Yes     Comment: wine a couple times a week  . Drug use: No  . Sexual activity: Not on file   Other Topics Concern  . Not on file   Social History Narrative   Lives alone at home   Caffeine- coffee, 1 cup daily, occas soda     PHYSICAL EXAM  GENERAL EXAM/CONSTITUTIONAL: Vitals:  Vitals:   07/08/16 0807  BP: (!) 146/88  Pulse: 82  Weight: 237 lb 12.8 oz (107.9 kg)  Height: 5\' 2"  (1.575 m)     Body mass index is 43.49 kg/m.  Visual Acuity Screening   Right eye Left eye Both eyes  Without correction:     With correction: 20/30 20/30      Patient is in no distress; well developed, nourished and groomed; neck is supple  CARDIOVASCULAR:  Examination of carotid arteries is normal; no carotid bruits  Regular rate and rhythm, no murmurs  Examination of peripheral vascular system by observation  and palpation is normal  EYES:  Ophthalmoscopic exam of optic discs and posterior segments is normal; no papilledema or hemorrhages  MUSCULOSKELETAL:  Gait, strength, tone, movements noted in Neurologic exam below  NEUROLOGIC: MENTAL STATUS:  MMSE - Mini Mental State Exam 07/08/2016  Orientation to time 5  Orientation to Place 5  Registration 3  Attention/ Calculation 5  Recall 3  Language- name 2 objects 2  Language- repeat 1  Language- follow 3 step command 3  Language- read & follow  direction 1  Write a sentence 1  Copy design 1  Total score 30    awake, alert, oriented to person, place and time  recent and remote memory intact  normal attention and concentration  language fluent, comprehension intact, naming intact,   fund of knowledge appropriate  CRANIAL NERVE:   2nd - no papilledema on fundoscopic exam  2nd, 3rd, 4th, 6th - pupils equal and reactive to light, visual fields full to confrontation, extraocular muscles intact, no nystagmus  5th - facial sensation symmetric  7th - facial strength symmetric  8th - hearing intact  9th - palate elevates symmetrically, uvula midline  11th - shoulder shrug symmetric  12th - tongue protrusion midline  MOTOR:   normal bulk and tone, full strength in the BUE, BLE  SENSORY:   normal and symmetric to light touch, pinprick, temperature, vibration  COORDINATION:   finger-nose-finger, fine finger movements normal  REFLEXES:   deep tendon reflexes present and symmetric  GAIT/STATION:   narrow based gait; able to walk on toes, heels and tandem; romberg is negative    DIAGNOSTIC DATA (LABS, IMAGING, TESTING) - I reviewed patient records, labs, notes, testing and imaging myself where available.  Lab Results  Component Value Date   WBC 4.7 06/25/2015   HGB 14.8 06/25/2015   HCT 45.4 06/25/2015   MCV 90.8 06/25/2015   PLT 281 06/25/2015      Component Value Date/Time   NA 135 06/25/2015 1735    K 4.2 06/25/2015 1735   CL 101 06/25/2015 1735   CO2 25 06/25/2015 1735   GLUCOSE 137 (H) 06/25/2015 1735   BUN 5 (L) 06/25/2015 1735   CREATININE 0.71 06/25/2015 1735   CALCIUM 9.8 06/25/2015 1735   PROT 7.3 05/08/2014 1622   ALBUMIN 3.9 05/08/2014 1622   AST 40 (H) 05/08/2014 1622   ALT 43 (H) 05/08/2014 1622   ALKPHOS 141 (H) 05/08/2014 1622   BILITOT 0.4 05/08/2014 1622   GFRNONAA >60 06/25/2015 1735   GFRAA >60 06/25/2015 1735   No results found for: CHOL, HDL, LDLCALC, LDLDIRECT, TRIG, CHOLHDL No results found for: HGBA1C No results found for: VITAMINB12 No results found for: TSH     ASSESSMENT AND PLAN  65 y.o. year old female here with one year of progressive cognitive difficulties including short-term memory loss, confusion, word finding difficulties and general problem solving impairment. Also with comorbid history of depression, stress, obesity, thyroid disease and sleep apnea.   Ddx: mild cognitive impairment, stress reaction, metabolic, obesity cognitive dysfunction; mild/prodromal dementia less likely  1. Memory loss      PLAN: - I will check MRI brain (rule out stroke, MS, inflammation) and B12 level - encouraged patient to optimize nutrition, fitness, stimulating activities, stress mgmt techniques  Orders Placed This Encounter  Procedures  . MR BRAIN WO CONTRAST  . Vitamin B12   Return in about 3 months (around 10/08/2016).    Penni Bombard, MD [id number], [id number] AM Certified in Neurology, Neurophysiology and Neuroimaging  [facility] [address], [place], [place] [address] [phone]

## 2016-07-09 ENCOUNTER — Telehealth: Payer: Self-pay | Admitting: *Deleted

## 2016-07-09 LAB — VITAMIN B12: VITAMIN B 12: 264 pg/mL (ref 211–946)

## 2016-07-09 NOTE — Telephone Encounter (Signed)
Spoke with patient and informed her that her Vit B12 level was within normal range but on low side of range. Advised if she did want to take Vit B12 supplement, to take low dose.  She questioned if she should have MRI; advised her that it would indicate if there were any other causes to her memory and cognitive issues. She stated she would go ahead and have MRI, is waiting for call to schedule it. She verbalized understanding, appreciation of call.

## 2016-07-15 ENCOUNTER — Other Ambulatory Visit: Payer: Self-pay | Admitting: Internal Medicine

## 2016-07-20 ENCOUNTER — Telehealth (INDEPENDENT_AMBULATORY_CARE_PROVIDER_SITE_OTHER): Payer: Self-pay | Admitting: Orthopaedic Surgery

## 2016-07-20 NOTE — Telephone Encounter (Signed)
Ms. Job called saying she had a Rt Knee replacement two years ago. Her Neurologist wants her to have a MRI Brain and she thought Dr. Lorin Mercy told her she'd never be able to have a MRI again. She wants a phone call regarding this to make sure if she can or can NOT have a MRI. Please advise.  Pt's ph# [phone] Thank you.

## 2016-07-21 ENCOUNTER — Ambulatory Visit
Admission: RE | Admit: 2016-07-21 | Discharge: 2016-07-21 | Disposition: A | Discharge status: Home / Self Care | Payer: BLUE CROSS/BLUE SHIELD | Source: Ambulatory Visit | Attending: Diagnostic Neuroimaging | Admitting: Diagnostic Neuroimaging

## 2016-07-21 DIAGNOSIS — R413 Other amnesia: Secondary | ICD-10-CM

## 2016-07-22 NOTE — Telephone Encounter (Signed)
Please advise.  Is patient ok for MRI?

## 2016-07-23 NOTE — Telephone Encounter (Signed)
Called patient and advised

## 2016-07-23 NOTE — Telephone Encounter (Signed)
Yes OK for MRI

## 2016-07-29 ENCOUNTER — Telehealth: Payer: Self-pay | Admitting: *Deleted

## 2016-07-29 NOTE — Telephone Encounter (Signed)
Per Dr Leta Baptist, LVM informing patient her MRI brain results are unremarkable, no major findings.  He recommends she continue with treatment plan he discussed with her in office. Reminded her of her FU in 3 months. Left name, number for any questions.

## 2016-10-09 ENCOUNTER — Ambulatory Visit: Payer: BLUE CROSS/BLUE SHIELD | Admitting: Diagnostic Neuroimaging

## 2016-11-09 ENCOUNTER — Ambulatory Visit: Payer: BLUE CROSS/BLUE SHIELD | Admitting: Diagnostic Neuroimaging

## 2017-04-28 ENCOUNTER — Other Ambulatory Visit: Payer: Self-pay | Admitting: Physician Assistant

## 2017-04-28 DIAGNOSIS — R1032 Left lower quadrant pain: Secondary | ICD-10-CM

## 2017-04-28 DIAGNOSIS — K5792 Diverticulitis of intestine, part unspecified, without perforation or abscess without bleeding: Secondary | ICD-10-CM

## 2017-05-04 ENCOUNTER — Ambulatory Visit
Admission: RE | Admit: 2017-05-04 | Discharge: 2017-05-04 | Disposition: A | Discharge status: Home / Self Care | Payer: BLUE CROSS/BLUE SHIELD | Source: Ambulatory Visit | Attending: Physician Assistant | Admitting: Physician Assistant

## 2017-05-04 DIAGNOSIS — R1032 Left lower quadrant pain: Secondary | ICD-10-CM

## 2017-05-04 DIAGNOSIS — K5792 Diverticulitis of intestine, part unspecified, without perforation or abscess without bleeding: Secondary | ICD-10-CM

## 2017-05-04 MED ORDER — IOPAMIDOL (ISOVUE-300) INJECTION 61%
100.0000 mL | Freq: Once | INTRAVENOUS | Status: AC | PRN
  Administered 2017-05-04: 100 mL via INTRAVENOUS

## 2017-05-27 ENCOUNTER — Other Ambulatory Visit: Payer: Self-pay | Admitting: Gastroenterology

## 2017-05-27 ENCOUNTER — Ambulatory Visit
Admission: RE | Admit: 2017-05-27 | Discharge: 2017-05-27 | Disposition: A | Discharge status: Home / Self Care | Payer: BLUE CROSS/BLUE SHIELD | Source: Ambulatory Visit | Attending: Gastroenterology | Admitting: Gastroenterology

## 2017-05-27 DIAGNOSIS — K5901 Slow transit constipation: Secondary | ICD-10-CM

## 2017-05-27 DIAGNOSIS — R1032 Left lower quadrant pain: Secondary | ICD-10-CM

## 2018-01-24 DIAGNOSIS — K5900 Constipation, unspecified: Secondary | ICD-10-CM | POA: Diagnosis not present

## 2018-01-24 DIAGNOSIS — K644 Residual hemorrhoidal skin tags: Secondary | ICD-10-CM | POA: Diagnosis not present

## 2018-02-16 DIAGNOSIS — Z1389 Encounter for screening for other disorder: Secondary | ICD-10-CM | POA: Diagnosis not present

## 2018-02-16 DIAGNOSIS — J449 Chronic obstructive pulmonary disease, unspecified: Secondary | ICD-10-CM | POA: Diagnosis not present

## 2018-02-16 DIAGNOSIS — Z136 Encounter for screening for cardiovascular disorders: Secondary | ICD-10-CM | POA: Diagnosis not present

## 2018-02-16 DIAGNOSIS — D229 Melanocytic nevi, unspecified: Secondary | ICD-10-CM | POA: Diagnosis not present

## 2018-02-16 DIAGNOSIS — E039 Hypothyroidism, unspecified: Secondary | ICD-10-CM | POA: Diagnosis not present

## 2018-02-16 DIAGNOSIS — F322 Major depressive disorder, single episode, severe without psychotic features: Secondary | ICD-10-CM | POA: Diagnosis not present

## 2018-02-16 DIAGNOSIS — Z0000 Encounter for general adult medical examination without abnormal findings: Secondary | ICD-10-CM | POA: Diagnosis not present

## 2018-02-16 DIAGNOSIS — Z1159 Encounter for screening for other viral diseases: Secondary | ICD-10-CM | POA: Diagnosis not present

## 2018-02-16 DIAGNOSIS — Z23 Encounter for immunization: Secondary | ICD-10-CM | POA: Diagnosis not present

## 2018-02-16 DIAGNOSIS — E7800 Pure hypercholesterolemia, unspecified: Secondary | ICD-10-CM | POA: Diagnosis not present

## 2018-03-08 DIAGNOSIS — L821 Other seborrheic keratosis: Secondary | ICD-10-CM | POA: Diagnosis not present

## 2018-03-08 DIAGNOSIS — L820 Inflamed seborrheic keratosis: Secondary | ICD-10-CM | POA: Diagnosis not present

## 2018-03-08 DIAGNOSIS — L728 Other follicular cysts of the skin and subcutaneous tissue: Secondary | ICD-10-CM | POA: Diagnosis not present

## 2018-03-08 DIAGNOSIS — Z1283 Encounter for screening for malignant neoplasm of skin: Secondary | ICD-10-CM | POA: Diagnosis not present

## 2018-04-18 DIAGNOSIS — E7800 Pure hypercholesterolemia, unspecified: Secondary | ICD-10-CM | POA: Diagnosis not present

## 2018-06-02 DIAGNOSIS — J449 Chronic obstructive pulmonary disease, unspecified: Secondary | ICD-10-CM | POA: Diagnosis not present

## 2018-06-02 DIAGNOSIS — Z124 Encounter for screening for malignant neoplasm of cervix: Secondary | ICD-10-CM | POA: Diagnosis not present

## 2018-06-02 DIAGNOSIS — Z1231 Encounter for screening mammogram for malignant neoplasm of breast: Secondary | ICD-10-CM | POA: Diagnosis not present

## 2018-06-02 DIAGNOSIS — F322 Major depressive disorder, single episode, severe without psychotic features: Secondary | ICD-10-CM | POA: Diagnosis not present

## 2018-06-02 DIAGNOSIS — Z01419 Encounter for gynecological examination (general) (routine) without abnormal findings: Secondary | ICD-10-CM | POA: Diagnosis not present

## 2018-06-02 DIAGNOSIS — K644 Residual hemorrhoidal skin tags: Secondary | ICD-10-CM | POA: Diagnosis not present

## 2018-06-02 DIAGNOSIS — K5900 Constipation, unspecified: Secondary | ICD-10-CM | POA: Diagnosis not present

## 2018-06-02 DIAGNOSIS — Z23 Encounter for immunization: Secondary | ICD-10-CM | POA: Diagnosis not present

## 2018-06-02 DIAGNOSIS — E7800 Pure hypercholesterolemia, unspecified: Secondary | ICD-10-CM | POA: Diagnosis not present

## 2018-06-02 DIAGNOSIS — E039 Hypothyroidism, unspecified: Secondary | ICD-10-CM | POA: Diagnosis not present

## 2018-06-28 DIAGNOSIS — K641 Second degree hemorrhoids: Secondary | ICD-10-CM | POA: Diagnosis not present

## 2018-07-07 DIAGNOSIS — H524 Presbyopia: Secondary | ICD-10-CM | POA: Diagnosis not present

## 2018-07-07 DIAGNOSIS — H2511 Age-related nuclear cataract, right eye: Secondary | ICD-10-CM | POA: Diagnosis not present

## 2018-07-07 DIAGNOSIS — H04123 Dry eye syndrome of bilateral lacrimal glands: Secondary | ICD-10-CM | POA: Diagnosis not present

## 2018-08-26 DIAGNOSIS — K641 Second degree hemorrhoids: Secondary | ICD-10-CM | POA: Diagnosis not present

## 2018-09-22 DIAGNOSIS — J441 Chronic obstructive pulmonary disease with (acute) exacerbation: Secondary | ICD-10-CM | POA: Diagnosis not present

## 2018-10-06 DIAGNOSIS — Z6841 Body Mass Index (BMI) 40.0 and over, adult: Secondary | ICD-10-CM | POA: Diagnosis not present

## 2018-10-06 DIAGNOSIS — E039 Hypothyroidism, unspecified: Secondary | ICD-10-CM | POA: Diagnosis not present

## 2018-10-06 DIAGNOSIS — J449 Chronic obstructive pulmonary disease, unspecified: Secondary | ICD-10-CM | POA: Diagnosis not present

## 2018-10-06 DIAGNOSIS — G4733 Obstructive sleep apnea (adult) (pediatric): Secondary | ICD-10-CM | POA: Diagnosis not present

## 2018-10-06 DIAGNOSIS — R69 Illness, unspecified: Secondary | ICD-10-CM | POA: Diagnosis not present

## 2018-10-06 DIAGNOSIS — E7800 Pure hypercholesterolemia, unspecified: Secondary | ICD-10-CM | POA: Diagnosis not present

## 2019-01-09 DIAGNOSIS — Z20828 Contact with and (suspected) exposure to other viral communicable diseases: Secondary | ICD-10-CM | POA: Diagnosis not present

## 2019-01-11 DIAGNOSIS — Z03818 Encounter for observation for suspected exposure to other biological agents ruled out: Secondary | ICD-10-CM | POA: Diagnosis not present

## 2019-02-20 DIAGNOSIS — K5792 Diverticulitis of intestine, part unspecified, without perforation or abscess without bleeding: Secondary | ICD-10-CM | POA: Diagnosis not present

## 2019-02-20 DIAGNOSIS — J449 Chronic obstructive pulmonary disease, unspecified: Secondary | ICD-10-CM | POA: Diagnosis not present

## 2019-02-20 DIAGNOSIS — Z0000 Encounter for general adult medical examination without abnormal findings: Secondary | ICD-10-CM | POA: Diagnosis not present

## 2019-02-20 DIAGNOSIS — Z1389 Encounter for screening for other disorder: Secondary | ICD-10-CM | POA: Diagnosis not present

## 2019-02-20 DIAGNOSIS — E7800 Pure hypercholesterolemia, unspecified: Secondary | ICD-10-CM | POA: Diagnosis not present

## 2019-02-20 DIAGNOSIS — E039 Hypothyroidism, unspecified: Secondary | ICD-10-CM | POA: Diagnosis not present

## 2019-02-20 DIAGNOSIS — G4733 Obstructive sleep apnea (adult) (pediatric): Secondary | ICD-10-CM | POA: Diagnosis not present

## 2019-02-20 DIAGNOSIS — R69 Illness, unspecified: Secondary | ICD-10-CM | POA: Diagnosis not present

## 2019-02-24 DIAGNOSIS — Z0100 Encounter for examination of eyes and vision without abnormal findings: Secondary | ICD-10-CM | POA: Diagnosis not present

## 2019-03-07 DIAGNOSIS — E039 Hypothyroidism, unspecified: Secondary | ICD-10-CM | POA: Diagnosis not present

## 2019-03-07 DIAGNOSIS — E7800 Pure hypercholesterolemia, unspecified: Secondary | ICD-10-CM | POA: Diagnosis not present

## 2019-03-07 DIAGNOSIS — K5792 Diverticulitis of intestine, part unspecified, without perforation or abscess without bleeding: Secondary | ICD-10-CM | POA: Diagnosis not present

## 2019-04-06 DIAGNOSIS — E785 Hyperlipidemia, unspecified: Secondary | ICD-10-CM | POA: Diagnosis not present

## 2019-04-06 DIAGNOSIS — G8929 Other chronic pain: Secondary | ICD-10-CM | POA: Diagnosis not present

## 2019-04-06 DIAGNOSIS — J449 Chronic obstructive pulmonary disease, unspecified: Secondary | ICD-10-CM | POA: Diagnosis not present

## 2019-04-06 DIAGNOSIS — H547 Unspecified visual loss: Secondary | ICD-10-CM | POA: Diagnosis not present

## 2019-04-06 DIAGNOSIS — E039 Hypothyroidism, unspecified: Secondary | ICD-10-CM | POA: Diagnosis not present

## 2019-04-06 DIAGNOSIS — Z720 Tobacco use: Secondary | ICD-10-CM | POA: Diagnosis not present

## 2019-04-06 DIAGNOSIS — R69 Illness, unspecified: Secondary | ICD-10-CM | POA: Diagnosis not present

## 2019-04-06 DIAGNOSIS — M1990 Unspecified osteoarthritis, unspecified site: Secondary | ICD-10-CM | POA: Diagnosis not present

## 2019-04-06 DIAGNOSIS — R32 Unspecified urinary incontinence: Secondary | ICD-10-CM | POA: Diagnosis not present

## 2019-04-28 DIAGNOSIS — R69 Illness, unspecified: Secondary | ICD-10-CM | POA: Diagnosis not present

## 2019-06-14 DIAGNOSIS — R69 Illness, unspecified: Secondary | ICD-10-CM | POA: Diagnosis not present

## 2019-06-14 DIAGNOSIS — J449 Chronic obstructive pulmonary disease, unspecified: Secondary | ICD-10-CM | POA: Diagnosis not present

## 2019-06-14 DIAGNOSIS — M179 Osteoarthritis of knee, unspecified: Secondary | ICD-10-CM | POA: Diagnosis not present

## 2019-06-20 DIAGNOSIS — Z03818 Encounter for observation for suspected exposure to other biological agents ruled out: Secondary | ICD-10-CM | POA: Diagnosis not present

## 2019-06-28 ENCOUNTER — Encounter (HOSPITAL_COMMUNITY): Payer: Self-pay | Admitting: Emergency Medicine

## 2019-06-28 ENCOUNTER — Emergency Department (HOSPITAL_COMMUNITY)
Admission: EM | Admit: 2019-06-28 | Discharge: 2019-06-29 | Discharge status: Against Medical Advice | Payer: Medicare HMO | Attending: Emergency Medicine | Admitting: Emergency Medicine

## 2019-06-28 ENCOUNTER — Other Ambulatory Visit: Payer: Self-pay

## 2019-06-28 DIAGNOSIS — Z5321 Procedure and treatment not carried out due to patient leaving prior to being seen by health care provider: Secondary | ICD-10-CM | POA: Diagnosis not present

## 2019-06-28 DIAGNOSIS — K625 Hemorrhage of anus and rectum: Secondary | ICD-10-CM | POA: Diagnosis present

## 2019-06-28 HISTORY — DX: Diverticulitis of intestine, part unspecified, without perforation or abscess without bleeding: K57.92

## 2019-06-28 LAB — PROTIME-INR
INR: 0.9 (ref 0.8–1.2)
Prothrombin Time: 12.4 seconds (ref 11.4–15.2)

## 2019-06-28 LAB — COMPREHENSIVE METABOLIC PANEL
ALT: 46 U/L — ABNORMAL HIGH (ref 0–44)
AST: 47 U/L — ABNORMAL HIGH (ref 15–41)
Albumin: 3.7 g/dL (ref 3.5–5.0)
Alkaline Phosphatase: 144 U/L — ABNORMAL HIGH (ref 38–126)
Anion gap: 9 (ref 5–15)
BUN: 9 mg/dL (ref 8–23)
CO2: 26 mmol/L (ref 22–32)
Calcium: 10.1 mg/dL (ref 8.9–10.3)
Chloride: 106 mmol/L (ref 98–111)
Creatinine, Ser: 0.9 mg/dL (ref 0.44–1.00)
GFR calc Af Amer: >60 mL/min (ref >60)
GFR calc non Af Amer: >60 mL/min (ref >60)
Glucose, Bld: 104 mg/dL — ABNORMAL HIGH (ref 70–99)
Potassium: 4.1 mmol/L (ref 3.5–5.1)
Sodium: 141 mmol/L (ref 135–145)
Total Bilirubin: 0.6 mg/dL (ref 0.3–1.2)
Total Protein: 6.5 g/dL (ref 6.5–8.1)

## 2019-06-28 LAB — SAMPLE TO BLOOD BANK

## 2019-06-28 LAB — CBC WITH DIFFERENTIAL/PLATELET
Abs Immature Granulocytes: 0.03 10*3/uL (ref 0.00–0.07)
Basophils Absolute: 0.1 10*3/uL (ref 0.0–0.1)
Basophils Relative: 1 %
Eosinophils Absolute: 0.2 10*3/uL (ref 0.0–0.5)
Eosinophils Relative: 3 %
HCT: 41.6 % (ref 36.0–46.0)
Hemoglobin: 13.8 g/dL (ref 12.0–15.0)
Immature Granulocytes: 0 %
Lymphocytes Relative: 36 %
Lymphs Abs: 2.9 10*3/uL (ref 0.7–4.0)
MCH: 31 pg (ref 26.0–34.0)
MCHC: 33.2 g/dL (ref 30.0–36.0)
MCV: 93.5 fL (ref 80.0–100.0)
Monocytes Absolute: 0.8 10*3/uL (ref 0.1–1.0)
Monocytes Relative: 10 %
Neutro Abs: 4.1 10*3/uL (ref 1.7–7.7)
Neutrophils Relative %: 50 %
Platelets: 272 10*3/uL (ref 150–400)
RBC: 4.45 MIL/uL (ref 3.87–5.11)
RDW: 13.5 % (ref 11.5–15.5)
WBC: 8 10*3/uL (ref 4.0–10.5)
nRBC: 0 % (ref 0.0–0.2)

## 2019-06-28 NOTE — ED Triage Notes (Signed)
Patient reports rectal bleeding onset yesterday with fatigue , denies emesis or fever , no abdominal pain .

## 2019-06-29 DIAGNOSIS — K625 Hemorrhage of anus and rectum: Secondary | ICD-10-CM | POA: Diagnosis not present

## 2019-06-29 DIAGNOSIS — Z01419 Encounter for gynecological examination (general) (routine) without abnormal findings: Secondary | ICD-10-CM | POA: Diagnosis not present

## 2019-06-29 DIAGNOSIS — Z1231 Encounter for screening mammogram for malignant neoplasm of breast: Secondary | ICD-10-CM | POA: Diagnosis not present

## 2019-06-29 NOTE — ED Notes (Signed)
No answer x4 in waiting room.

## 2019-06-29 NOTE — ED Notes (Signed)
Called pt for room, no response.

## 2019-07-05 DIAGNOSIS — K5900 Constipation, unspecified: Secondary | ICD-10-CM | POA: Diagnosis not present

## 2019-07-05 DIAGNOSIS — K644 Residual hemorrhoidal skin tags: Secondary | ICD-10-CM | POA: Diagnosis not present

## 2019-07-05 DIAGNOSIS — K625 Hemorrhage of anus and rectum: Secondary | ICD-10-CM | POA: Diagnosis not present

## 2019-07-05 DIAGNOSIS — Z86010 Personal history of colonic polyps: Secondary | ICD-10-CM | POA: Diagnosis not present

## 2019-07-11 DIAGNOSIS — H2511 Age-related nuclear cataract, right eye: Secondary | ICD-10-CM | POA: Diagnosis not present

## 2019-07-11 DIAGNOSIS — H5211 Myopia, right eye: Secondary | ICD-10-CM | POA: Diagnosis not present

## 2019-07-11 DIAGNOSIS — H5202 Hypermetropia, left eye: Secondary | ICD-10-CM | POA: Diagnosis not present

## 2019-07-25 DIAGNOSIS — Z1159 Encounter for screening for other viral diseases: Secondary | ICD-10-CM | POA: Diagnosis not present

## 2019-07-28 DIAGNOSIS — K625 Hemorrhage of anus and rectum: Secondary | ICD-10-CM | POA: Diagnosis not present

## 2019-07-28 DIAGNOSIS — K648 Other hemorrhoids: Secondary | ICD-10-CM | POA: Diagnosis not present

## 2019-07-28 DIAGNOSIS — D122 Benign neoplasm of ascending colon: Secondary | ICD-10-CM | POA: Diagnosis not present

## 2019-07-28 DIAGNOSIS — K5730 Diverticulosis of large intestine without perforation or abscess without bleeding: Secondary | ICD-10-CM | POA: Diagnosis not present

## 2019-08-01 DIAGNOSIS — D122 Benign neoplasm of ascending colon: Secondary | ICD-10-CM | POA: Diagnosis not present

## 2019-08-29 DIAGNOSIS — R509 Fever, unspecified: Secondary | ICD-10-CM | POA: Diagnosis not present

## 2019-08-29 DIAGNOSIS — R52 Pain, unspecified: Secondary | ICD-10-CM | POA: Diagnosis not present

## 2019-08-30 DIAGNOSIS — R52 Pain, unspecified: Secondary | ICD-10-CM | POA: Diagnosis not present

## 2019-08-30 DIAGNOSIS — R509 Fever, unspecified: Secondary | ICD-10-CM | POA: Diagnosis not present

## 2019-09-04 ENCOUNTER — Telehealth: Payer: Self-pay | Admitting: Nurse Practitioner

## 2019-09-04 NOTE — Telephone Encounter (Signed)
Called to Discuss with patient about Covid symptoms and the use of bamlanivimab, a monoclonal antibody infusion for those with mild to moderate Covid symptoms and at a high risk of hospitalization.     Pt will be out of the 10 day window before she can be scheduled. Will place on cancellation list.

## 2019-09-06 ENCOUNTER — Encounter (HOSPITAL_COMMUNITY): Payer: Self-pay

## 2019-09-12 DIAGNOSIS — J449 Chronic obstructive pulmonary disease, unspecified: Secondary | ICD-10-CM | POA: Diagnosis not present

## 2019-09-12 DIAGNOSIS — R69 Illness, unspecified: Secondary | ICD-10-CM | POA: Diagnosis not present

## 2019-09-12 DIAGNOSIS — U071 COVID-19: Secondary | ICD-10-CM | POA: Diagnosis not present

## 2019-09-12 DIAGNOSIS — E7800 Pure hypercholesterolemia, unspecified: Secondary | ICD-10-CM | POA: Diagnosis not present

## 2019-09-12 DIAGNOSIS — G4733 Obstructive sleep apnea (adult) (pediatric): Secondary | ICD-10-CM | POA: Diagnosis not present

## 2019-09-12 DIAGNOSIS — E039 Hypothyroidism, unspecified: Secondary | ICD-10-CM | POA: Diagnosis not present

## 2019-09-27 DIAGNOSIS — U071 COVID-19: Secondary | ICD-10-CM | POA: Diagnosis not present

## 2019-09-27 DIAGNOSIS — E039 Hypothyroidism, unspecified: Secondary | ICD-10-CM | POA: Diagnosis not present

## 2019-10-17 DIAGNOSIS — R7989 Other specified abnormal findings of blood chemistry: Secondary | ICD-10-CM | POA: Diagnosis not present

## 2019-11-13 DIAGNOSIS — K644 Residual hemorrhoidal skin tags: Secondary | ICD-10-CM | POA: Diagnosis not present

## 2019-11-13 DIAGNOSIS — K5900 Constipation, unspecified: Secondary | ICD-10-CM | POA: Diagnosis not present

## 2019-11-13 DIAGNOSIS — Z86010 Personal history of colonic polyps: Secondary | ICD-10-CM | POA: Diagnosis not present

## 2019-12-15 DIAGNOSIS — K641 Second degree hemorrhoids: Secondary | ICD-10-CM | POA: Diagnosis not present

## 2019-12-29 ENCOUNTER — Ambulatory Visit (HOSPITAL_COMMUNITY)
Admission: RE | Admit: 2019-12-29 | Discharge: 2019-12-29 | Disposition: A | Discharge status: Home / Self Care | Payer: Medicare HMO | Source: Ambulatory Visit | Attending: Internal Medicine | Admitting: Internal Medicine

## 2019-12-29 ENCOUNTER — Other Ambulatory Visit (HOSPITAL_COMMUNITY): Payer: Self-pay | Admitting: Internal Medicine

## 2019-12-29 ENCOUNTER — Other Ambulatory Visit: Payer: Self-pay | Admitting: Internal Medicine

## 2019-12-29 ENCOUNTER — Other Ambulatory Visit: Payer: Self-pay

## 2019-12-29 DIAGNOSIS — R1013 Epigastric pain: Secondary | ICD-10-CM

## 2019-12-29 DIAGNOSIS — K7689 Other specified diseases of liver: Secondary | ICD-10-CM | POA: Diagnosis not present

## 2020-01-08 DIAGNOSIS — H524 Presbyopia: Secondary | ICD-10-CM | POA: Diagnosis not present

## 2020-01-08 DIAGNOSIS — H2513 Age-related nuclear cataract, bilateral: Secondary | ICD-10-CM | POA: Diagnosis not present

## 2020-01-19 DIAGNOSIS — Z7951 Long term (current) use of inhaled steroids: Secondary | ICD-10-CM | POA: Diagnosis not present

## 2020-01-19 DIAGNOSIS — R69 Illness, unspecified: Secondary | ICD-10-CM | POA: Diagnosis not present

## 2020-01-19 DIAGNOSIS — Z809 Family history of malignant neoplasm, unspecified: Secondary | ICD-10-CM | POA: Diagnosis not present

## 2020-01-19 DIAGNOSIS — E039 Hypothyroidism, unspecified: Secondary | ICD-10-CM | POA: Diagnosis not present

## 2020-01-19 DIAGNOSIS — K219 Gastro-esophageal reflux disease without esophagitis: Secondary | ICD-10-CM | POA: Diagnosis not present

## 2020-01-19 DIAGNOSIS — J449 Chronic obstructive pulmonary disease, unspecified: Secondary | ICD-10-CM | POA: Diagnosis not present

## 2020-01-19 DIAGNOSIS — Z6841 Body Mass Index (BMI) 40.0 and over, adult: Secondary | ICD-10-CM | POA: Diagnosis not present

## 2020-01-19 DIAGNOSIS — R030 Elevated blood-pressure reading, without diagnosis of hypertension: Secondary | ICD-10-CM | POA: Diagnosis not present

## 2020-01-19 DIAGNOSIS — Z720 Tobacco use: Secondary | ICD-10-CM | POA: Diagnosis not present

## 2020-03-05 DIAGNOSIS — R69 Illness, unspecified: Secondary | ICD-10-CM | POA: Diagnosis not present

## 2020-03-05 DIAGNOSIS — K648 Other hemorrhoids: Secondary | ICD-10-CM | POA: Diagnosis not present

## 2020-03-05 DIAGNOSIS — Z1389 Encounter for screening for other disorder: Secondary | ICD-10-CM | POA: Diagnosis not present

## 2020-03-05 DIAGNOSIS — J449 Chronic obstructive pulmonary disease, unspecified: Secondary | ICD-10-CM | POA: Diagnosis not present

## 2020-03-05 DIAGNOSIS — G4733 Obstructive sleep apnea (adult) (pediatric): Secondary | ICD-10-CM | POA: Diagnosis not present

## 2020-03-05 DIAGNOSIS — E039 Hypothyroidism, unspecified: Secondary | ICD-10-CM | POA: Diagnosis not present

## 2020-03-05 DIAGNOSIS — Z23 Encounter for immunization: Secondary | ICD-10-CM | POA: Diagnosis not present

## 2020-03-05 DIAGNOSIS — E7800 Pure hypercholesterolemia, unspecified: Secondary | ICD-10-CM | POA: Diagnosis not present

## 2020-03-05 DIAGNOSIS — Z0000 Encounter for general adult medical examination without abnormal findings: Secondary | ICD-10-CM | POA: Diagnosis not present

## 2020-03-05 DIAGNOSIS — D1803 Hemangioma of intra-abdominal structures: Secondary | ICD-10-CM | POA: Diagnosis not present

## 2020-03-05 DIAGNOSIS — K219 Gastro-esophageal reflux disease without esophagitis: Secondary | ICD-10-CM | POA: Diagnosis not present

## 2020-04-14 DIAGNOSIS — R05 Cough: Secondary | ICD-10-CM | POA: Diagnosis not present

## 2020-04-14 DIAGNOSIS — J449 Chronic obstructive pulmonary disease, unspecified: Secondary | ICD-10-CM | POA: Diagnosis not present

## 2020-04-14 DIAGNOSIS — Z03818 Encounter for observation for suspected exposure to other biological agents ruled out: Secondary | ICD-10-CM | POA: Diagnosis not present

## 2020-04-16 DIAGNOSIS — E7800 Pure hypercholesterolemia, unspecified: Secondary | ICD-10-CM | POA: Diagnosis not present

## 2020-05-18 DIAGNOSIS — R69 Illness, unspecified: Secondary | ICD-10-CM | POA: Diagnosis not present

## 2020-05-21 DIAGNOSIS — G4733 Obstructive sleep apnea (adult) (pediatric): Secondary | ICD-10-CM | POA: Diagnosis not present

## 2020-06-06 DIAGNOSIS — Z23 Encounter for immunization: Secondary | ICD-10-CM | POA: Diagnosis not present

## 2020-06-06 DIAGNOSIS — E7800 Pure hypercholesterolemia, unspecified: Secondary | ICD-10-CM | POA: Diagnosis not present

## 2020-06-06 DIAGNOSIS — K760 Fatty (change of) liver, not elsewhere classified: Secondary | ICD-10-CM | POA: Diagnosis not present

## 2020-06-06 DIAGNOSIS — E039 Hypothyroidism, unspecified: Secondary | ICD-10-CM | POA: Diagnosis not present

## 2020-06-11 DIAGNOSIS — G4733 Obstructive sleep apnea (adult) (pediatric): Secondary | ICD-10-CM | POA: Diagnosis not present

## 2020-06-24 DIAGNOSIS — G4733 Obstructive sleep apnea (adult) (pediatric): Secondary | ICD-10-CM | POA: Diagnosis not present

## 2020-06-24 DIAGNOSIS — G4700 Insomnia, unspecified: Secondary | ICD-10-CM | POA: Diagnosis not present

## 2020-06-30 DIAGNOSIS — J029 Acute pharyngitis, unspecified: Secondary | ICD-10-CM | POA: Diagnosis not present

## 2020-07-11 DIAGNOSIS — H2513 Age-related nuclear cataract, bilateral: Secondary | ICD-10-CM | POA: Diagnosis not present

## 2020-07-24 DIAGNOSIS — G4700 Insomnia, unspecified: Secondary | ICD-10-CM | POA: Diagnosis not present

## 2020-07-24 DIAGNOSIS — G4733 Obstructive sleep apnea (adult) (pediatric): Secondary | ICD-10-CM | POA: Diagnosis not present

## 2020-08-20 DIAGNOSIS — R030 Elevated blood-pressure reading, without diagnosis of hypertension: Secondary | ICD-10-CM | POA: Diagnosis not present

## 2020-08-20 DIAGNOSIS — R69 Illness, unspecified: Secondary | ICD-10-CM | POA: Diagnosis not present

## 2020-08-20 DIAGNOSIS — M7989 Other specified soft tissue disorders: Secondary | ICD-10-CM | POA: Diagnosis not present

## 2020-08-24 DIAGNOSIS — G4700 Insomnia, unspecified: Secondary | ICD-10-CM | POA: Diagnosis not present

## 2020-08-24 DIAGNOSIS — G4733 Obstructive sleep apnea (adult) (pediatric): Secondary | ICD-10-CM | POA: Diagnosis not present

## 2020-09-05 DIAGNOSIS — E7800 Pure hypercholesterolemia, unspecified: Secondary | ICD-10-CM | POA: Diagnosis not present

## 2020-09-05 DIAGNOSIS — I10 Essential (primary) hypertension: Secondary | ICD-10-CM | POA: Diagnosis not present

## 2020-09-05 DIAGNOSIS — G4733 Obstructive sleep apnea (adult) (pediatric): Secondary | ICD-10-CM | POA: Diagnosis not present

## 2020-09-05 DIAGNOSIS — J449 Chronic obstructive pulmonary disease, unspecified: Secondary | ICD-10-CM | POA: Diagnosis not present

## 2020-09-05 DIAGNOSIS — K219 Gastro-esophageal reflux disease without esophagitis: Secondary | ICD-10-CM | POA: Diagnosis not present

## 2020-09-05 DIAGNOSIS — E039 Hypothyroidism, unspecified: Secondary | ICD-10-CM | POA: Diagnosis not present

## 2020-09-19 DIAGNOSIS — I10 Essential (primary) hypertension: Secondary | ICD-10-CM | POA: Diagnosis not present

## 2020-09-19 DIAGNOSIS — E039 Hypothyroidism, unspecified: Secondary | ICD-10-CM | POA: Diagnosis not present

## 2020-09-24 DIAGNOSIS — G4700 Insomnia, unspecified: Secondary | ICD-10-CM | POA: Diagnosis not present

## 2020-09-24 DIAGNOSIS — G4733 Obstructive sleep apnea (adult) (pediatric): Secondary | ICD-10-CM | POA: Diagnosis not present

## 2020-10-06 DIAGNOSIS — I10 Essential (primary) hypertension: Secondary | ICD-10-CM | POA: Diagnosis not present

## 2020-10-06 DIAGNOSIS — E785 Hyperlipidemia, unspecified: Secondary | ICD-10-CM | POA: Diagnosis not present

## 2020-10-06 DIAGNOSIS — J449 Chronic obstructive pulmonary disease, unspecified: Secondary | ICD-10-CM | POA: Diagnosis not present

## 2020-10-06 DIAGNOSIS — R32 Unspecified urinary incontinence: Secondary | ICD-10-CM | POA: Diagnosis not present

## 2020-10-06 DIAGNOSIS — Z20822 Contact with and (suspected) exposure to covid-19: Secondary | ICD-10-CM | POA: Diagnosis not present

## 2020-10-06 DIAGNOSIS — R69 Illness, unspecified: Secondary | ICD-10-CM | POA: Diagnosis not present

## 2020-10-06 DIAGNOSIS — Z7951 Long term (current) use of inhaled steroids: Secondary | ICD-10-CM | POA: Diagnosis not present

## 2020-10-06 DIAGNOSIS — E039 Hypothyroidism, unspecified: Secondary | ICD-10-CM | POA: Diagnosis not present

## 2020-10-06 DIAGNOSIS — Z809 Family history of malignant neoplasm, unspecified: Secondary | ICD-10-CM | POA: Diagnosis not present

## 2020-10-14 DIAGNOSIS — H25811 Combined forms of age-related cataract, right eye: Secondary | ICD-10-CM | POA: Diagnosis not present

## 2020-10-14 DIAGNOSIS — H2511 Age-related nuclear cataract, right eye: Secondary | ICD-10-CM | POA: Diagnosis not present

## 2020-10-22 DIAGNOSIS — G4733 Obstructive sleep apnea (adult) (pediatric): Secondary | ICD-10-CM | POA: Diagnosis not present

## 2020-10-22 DIAGNOSIS — G4700 Insomnia, unspecified: Secondary | ICD-10-CM | POA: Diagnosis not present

## 2020-10-31 DIAGNOSIS — E039 Hypothyroidism, unspecified: Secondary | ICD-10-CM | POA: Diagnosis not present

## 2020-11-01 DIAGNOSIS — G4733 Obstructive sleep apnea (adult) (pediatric): Secondary | ICD-10-CM | POA: Diagnosis not present

## 2020-11-01 DIAGNOSIS — G4700 Insomnia, unspecified: Secondary | ICD-10-CM | POA: Diagnosis not present

## 2020-11-22 DIAGNOSIS — G4733 Obstructive sleep apnea (adult) (pediatric): Secondary | ICD-10-CM | POA: Diagnosis not present

## 2020-11-22 DIAGNOSIS — G4700 Insomnia, unspecified: Secondary | ICD-10-CM | POA: Diagnosis not present

## 2020-11-28 DIAGNOSIS — Z0100 Encounter for examination of eyes and vision without abnormal findings: Secondary | ICD-10-CM | POA: Diagnosis not present

## 2020-12-04 DIAGNOSIS — I10 Essential (primary) hypertension: Secondary | ICD-10-CM | POA: Diagnosis not present

## 2020-12-22 DIAGNOSIS — G4733 Obstructive sleep apnea (adult) (pediatric): Secondary | ICD-10-CM | POA: Diagnosis not present

## 2020-12-22 DIAGNOSIS — G4700 Insomnia, unspecified: Secondary | ICD-10-CM | POA: Diagnosis not present

## 2020-12-25 DIAGNOSIS — D1039 Benign neoplasm of other parts of mouth: Secondary | ICD-10-CM | POA: Diagnosis not present

## 2021-01-21 DIAGNOSIS — E039 Hypothyroidism, unspecified: Secondary | ICD-10-CM | POA: Diagnosis not present

## 2021-01-21 DIAGNOSIS — R69 Illness, unspecified: Secondary | ICD-10-CM | POA: Diagnosis not present

## 2021-01-21 DIAGNOSIS — M179 Osteoarthritis of knee, unspecified: Secondary | ICD-10-CM | POA: Diagnosis not present

## 2021-01-21 DIAGNOSIS — J45909 Unspecified asthma, uncomplicated: Secondary | ICD-10-CM | POA: Diagnosis not present

## 2021-01-21 DIAGNOSIS — E7800 Pure hypercholesterolemia, unspecified: Secondary | ICD-10-CM | POA: Diagnosis not present

## 2021-01-21 DIAGNOSIS — I10 Essential (primary) hypertension: Secondary | ICD-10-CM | POA: Diagnosis not present

## 2021-01-21 DIAGNOSIS — K219 Gastro-esophageal reflux disease without esophagitis: Secondary | ICD-10-CM | POA: Diagnosis not present

## 2021-01-21 DIAGNOSIS — J449 Chronic obstructive pulmonary disease, unspecified: Secondary | ICD-10-CM | POA: Diagnosis not present

## 2021-01-22 DIAGNOSIS — G4700 Insomnia, unspecified: Secondary | ICD-10-CM | POA: Diagnosis not present

## 2021-01-22 DIAGNOSIS — G4733 Obstructive sleep apnea (adult) (pediatric): Secondary | ICD-10-CM | POA: Diagnosis not present

## 2021-02-07 DIAGNOSIS — E7800 Pure hypercholesterolemia, unspecified: Secondary | ICD-10-CM | POA: Diagnosis not present

## 2021-02-07 DIAGNOSIS — I10 Essential (primary) hypertension: Secondary | ICD-10-CM | POA: Diagnosis not present

## 2021-02-07 DIAGNOSIS — J45909 Unspecified asthma, uncomplicated: Secondary | ICD-10-CM | POA: Diagnosis not present

## 2021-02-07 DIAGNOSIS — M179 Osteoarthritis of knee, unspecified: Secondary | ICD-10-CM | POA: Diagnosis not present

## 2021-02-07 DIAGNOSIS — K219 Gastro-esophageal reflux disease without esophagitis: Secondary | ICD-10-CM | POA: Diagnosis not present

## 2021-02-07 DIAGNOSIS — E039 Hypothyroidism, unspecified: Secondary | ICD-10-CM | POA: Diagnosis not present

## 2021-02-07 DIAGNOSIS — R69 Illness, unspecified: Secondary | ICD-10-CM | POA: Diagnosis not present

## 2021-02-07 DIAGNOSIS — J449 Chronic obstructive pulmonary disease, unspecified: Secondary | ICD-10-CM | POA: Diagnosis not present

## 2021-02-21 DIAGNOSIS — G4733 Obstructive sleep apnea (adult) (pediatric): Secondary | ICD-10-CM | POA: Diagnosis not present

## 2021-02-21 DIAGNOSIS — G4700 Insomnia, unspecified: Secondary | ICD-10-CM | POA: Diagnosis not present

## 2021-03-12 DIAGNOSIS — R69 Illness, unspecified: Secondary | ICD-10-CM | POA: Diagnosis not present

## 2021-03-12 DIAGNOSIS — J449 Chronic obstructive pulmonary disease, unspecified: Secondary | ICD-10-CM | POA: Diagnosis not present

## 2021-03-12 DIAGNOSIS — K5901 Slow transit constipation: Secondary | ICD-10-CM | POA: Diagnosis not present

## 2021-03-12 DIAGNOSIS — Z1389 Encounter for screening for other disorder: Secondary | ICD-10-CM | POA: Diagnosis not present

## 2021-03-12 DIAGNOSIS — G4733 Obstructive sleep apnea (adult) (pediatric): Secondary | ICD-10-CM | POA: Diagnosis not present

## 2021-03-12 DIAGNOSIS — Z0000 Encounter for general adult medical examination without abnormal findings: Secondary | ICD-10-CM | POA: Diagnosis not present

## 2021-03-12 DIAGNOSIS — E7800 Pure hypercholesterolemia, unspecified: Secondary | ICD-10-CM | POA: Diagnosis not present

## 2021-03-12 DIAGNOSIS — I10 Essential (primary) hypertension: Secondary | ICD-10-CM | POA: Diagnosis not present

## 2021-03-12 DIAGNOSIS — K760 Fatty (change of) liver, not elsewhere classified: Secondary | ICD-10-CM | POA: Diagnosis not present

## 2021-03-12 DIAGNOSIS — M179 Osteoarthritis of knee, unspecified: Secondary | ICD-10-CM | POA: Diagnosis not present

## 2021-03-12 DIAGNOSIS — E039 Hypothyroidism, unspecified: Secondary | ICD-10-CM | POA: Diagnosis not present

## 2021-03-24 DIAGNOSIS — R69 Illness, unspecified: Secondary | ICD-10-CM | POA: Diagnosis not present

## 2021-03-24 DIAGNOSIS — M179 Osteoarthritis of knee, unspecified: Secondary | ICD-10-CM | POA: Diagnosis not present

## 2021-03-24 DIAGNOSIS — E039 Hypothyroidism, unspecified: Secondary | ICD-10-CM | POA: Diagnosis not present

## 2021-03-24 DIAGNOSIS — I10 Essential (primary) hypertension: Secondary | ICD-10-CM | POA: Diagnosis not present

## 2021-03-24 DIAGNOSIS — E7800 Pure hypercholesterolemia, unspecified: Secondary | ICD-10-CM | POA: Diagnosis not present

## 2021-03-24 DIAGNOSIS — G4733 Obstructive sleep apnea (adult) (pediatric): Secondary | ICD-10-CM | POA: Diagnosis not present

## 2021-03-24 DIAGNOSIS — J449 Chronic obstructive pulmonary disease, unspecified: Secondary | ICD-10-CM | POA: Diagnosis not present

## 2021-03-24 DIAGNOSIS — J45909 Unspecified asthma, uncomplicated: Secondary | ICD-10-CM | POA: Diagnosis not present

## 2021-03-24 DIAGNOSIS — K219 Gastro-esophageal reflux disease without esophagitis: Secondary | ICD-10-CM | POA: Diagnosis not present

## 2021-03-24 DIAGNOSIS — G4700 Insomnia, unspecified: Secondary | ICD-10-CM | POA: Diagnosis not present

## 2021-04-24 DIAGNOSIS — G4700 Insomnia, unspecified: Secondary | ICD-10-CM | POA: Diagnosis not present

## 2021-04-24 DIAGNOSIS — G4733 Obstructive sleep apnea (adult) (pediatric): Secondary | ICD-10-CM | POA: Diagnosis not present

## 2021-04-25 DIAGNOSIS — Z01411 Encounter for gynecological examination (general) (routine) with abnormal findings: Secondary | ICD-10-CM | POA: Diagnosis not present

## 2021-04-25 DIAGNOSIS — Z6837 Body mass index (BMI) 37.0-37.9, adult: Secondary | ICD-10-CM | POA: Diagnosis not present

## 2021-04-25 DIAGNOSIS — Z124 Encounter for screening for malignant neoplasm of cervix: Secondary | ICD-10-CM | POA: Diagnosis not present

## 2021-04-25 DIAGNOSIS — Z01419 Encounter for gynecological examination (general) (routine) without abnormal findings: Secondary | ICD-10-CM | POA: Diagnosis not present

## 2021-04-25 DIAGNOSIS — Z87412 Personal history of vulvar dysplasia: Secondary | ICD-10-CM | POA: Diagnosis not present

## 2021-04-25 DIAGNOSIS — Z1231 Encounter for screening mammogram for malignant neoplasm of breast: Secondary | ICD-10-CM | POA: Diagnosis not present

## 2021-04-25 DIAGNOSIS — M8580 Other specified disorders of bone density and structure, unspecified site: Secondary | ICD-10-CM | POA: Diagnosis not present

## 2021-04-28 DIAGNOSIS — R69 Illness, unspecified: Secondary | ICD-10-CM | POA: Diagnosis not present

## 2021-04-28 DIAGNOSIS — F419 Anxiety disorder, unspecified: Secondary | ICD-10-CM | POA: Diagnosis not present

## 2021-04-28 DIAGNOSIS — E0590 Thyrotoxicosis, unspecified without thyrotoxic crisis or storm: Secondary | ICD-10-CM | POA: Diagnosis not present

## 2021-04-29 DIAGNOSIS — K5900 Constipation, unspecified: Secondary | ICD-10-CM | POA: Diagnosis not present

## 2021-04-29 DIAGNOSIS — R3589 Other polyuria: Secondary | ICD-10-CM | POA: Diagnosis not present

## 2021-04-29 DIAGNOSIS — M791 Myalgia, unspecified site: Secondary | ICD-10-CM | POA: Diagnosis not present

## 2021-04-29 DIAGNOSIS — R634 Abnormal weight loss: Secondary | ICD-10-CM | POA: Diagnosis not present

## 2021-04-29 DIAGNOSIS — R202 Paresthesia of skin: Secondary | ICD-10-CM | POA: Diagnosis not present

## 2021-04-29 DIAGNOSIS — E039 Hypothyroidism, unspecified: Secondary | ICD-10-CM | POA: Diagnosis not present

## 2021-05-08 DIAGNOSIS — K760 Fatty (change of) liver, not elsewhere classified: Secondary | ICD-10-CM | POA: Diagnosis not present

## 2021-05-08 DIAGNOSIS — R7989 Other specified abnormal findings of blood chemistry: Secondary | ICD-10-CM | POA: Diagnosis not present

## 2021-05-08 DIAGNOSIS — R32 Unspecified urinary incontinence: Secondary | ICD-10-CM | POA: Diagnosis not present

## 2021-05-08 DIAGNOSIS — R945 Abnormal results of liver function studies: Secondary | ICD-10-CM | POA: Diagnosis not present

## 2021-05-08 DIAGNOSIS — Z23 Encounter for immunization: Secondary | ICD-10-CM | POA: Diagnosis not present

## 2021-05-08 DIAGNOSIS — E039 Hypothyroidism, unspecified: Secondary | ICD-10-CM | POA: Diagnosis not present

## 2021-05-16 DIAGNOSIS — M8589 Other specified disorders of bone density and structure, multiple sites: Secondary | ICD-10-CM | POA: Diagnosis not present

## 2021-05-24 DIAGNOSIS — G4733 Obstructive sleep apnea (adult) (pediatric): Secondary | ICD-10-CM | POA: Diagnosis not present

## 2021-05-24 DIAGNOSIS — G4700 Insomnia, unspecified: Secondary | ICD-10-CM | POA: Diagnosis not present

## 2021-05-30 DIAGNOSIS — Z961 Presence of intraocular lens: Secondary | ICD-10-CM | POA: Diagnosis not present

## 2021-06-24 DIAGNOSIS — G4700 Insomnia, unspecified: Secondary | ICD-10-CM | POA: Diagnosis not present

## 2021-06-24 DIAGNOSIS — G4733 Obstructive sleep apnea (adult) (pediatric): Secondary | ICD-10-CM | POA: Diagnosis not present

## 2021-07-15 DIAGNOSIS — E039 Hypothyroidism, unspecified: Secondary | ICD-10-CM | POA: Diagnosis not present

## 2021-07-15 DIAGNOSIS — G4733 Obstructive sleep apnea (adult) (pediatric): Secondary | ICD-10-CM | POA: Diagnosis not present

## 2021-07-15 DIAGNOSIS — E7800 Pure hypercholesterolemia, unspecified: Secondary | ICD-10-CM | POA: Diagnosis not present

## 2021-07-15 DIAGNOSIS — J449 Chronic obstructive pulmonary disease, unspecified: Secondary | ICD-10-CM | POA: Diagnosis not present

## 2021-07-15 DIAGNOSIS — R69 Illness, unspecified: Secondary | ICD-10-CM | POA: Diagnosis not present

## 2021-07-31 ENCOUNTER — Other Ambulatory Visit: Payer: Self-pay

## 2021-07-31 ENCOUNTER — Ambulatory Visit
Admission: EM | Admit: 2021-07-31 | Discharge: 2021-07-31 | Disposition: A | Discharge status: Home / Self Care | Payer: Medicare HMO | Attending: Emergency Medicine | Admitting: Emergency Medicine

## 2021-07-31 DIAGNOSIS — R0981 Nasal congestion: Secondary | ICD-10-CM | POA: Diagnosis not present

## 2021-07-31 DIAGNOSIS — J449 Chronic obstructive pulmonary disease, unspecified: Secondary | ICD-10-CM | POA: Diagnosis not present

## 2021-07-31 DIAGNOSIS — J3489 Other specified disorders of nose and nasal sinuses: Secondary | ICD-10-CM | POA: Diagnosis not present

## 2021-07-31 DIAGNOSIS — R600 Localized edema: Secondary | ICD-10-CM | POA: Insufficient documentation

## 2021-07-31 DIAGNOSIS — J069 Acute upper respiratory infection, unspecified: Secondary | ICD-10-CM | POA: Insufficient documentation

## 2021-07-31 DIAGNOSIS — J029 Acute pharyngitis, unspecified: Secondary | ICD-10-CM | POA: Insufficient documentation

## 2021-07-31 DIAGNOSIS — Z7689 Persons encountering health services in other specified circumstances: Secondary | ICD-10-CM | POA: Diagnosis not present

## 2021-07-31 LAB — POCT RAPID STREP A (OFFICE): Rapid Strep A Screen: NEGATIVE

## 2021-07-31 MED ORDER — FLUTICASONE-SALMETEROL 250-50 MCG/ACT IN AEPB: INHALATION_SPRAY | RESPIRATORY_TRACT | 2 refills | Status: DC

## 2021-07-31 MED ORDER — IPRATROPIUM BROMIDE 0.06 % NA SOLN: 2.0000 | Freq: Four times a day (QID) | NASAL | 0 refills | Status: DC

## 2021-07-31 MED ORDER — ALBUTEROL SULFATE HFA 108 (90 BASE) MCG/ACT IN AERS: 2.0000 | INHALATION_SPRAY | RESPIRATORY_TRACT | 1 refills | Status: AC | PRN

## 2021-07-31 MED ORDER — HYDROCHLOROTHIAZIDE 25 MG PO TABS: 25.0000 mg | ORAL_TABLET | Freq: Every morning | ORAL | 2 refills | Status: DC

## 2021-07-31 NOTE — ED Triage Notes (Signed)
Pt reports having a sore throat, congestion and sneezing.  Started: yesterday

## 2021-07-31 NOTE — ED Provider Notes (Signed)
[facility]    CSN: [id number] Arrival date & time: 07/31/21  0806    HISTORY  No chief complaint on file.  HPI Jennifer Barker is a 70 y.o. female. Patient complains of a 1 day history of sore throat, congestion and sneezing.  Vital signs are normal on arrival today.  Patient is a current some day smoker, quit 33 years ago but resumed recently due to increased stress at home.  Patient has a history of COPD, currently using Advair two 50-50 but not as as prescribed, only inhales 1 puff at night.  Patient also has a history of obstructive sleep apnea, reports compliance with CPAP.  Also history of hypothyroidism, hypercholesterolemia, essential hypertension, fatty liver disease, major depression.  Patient states she is not been using albuterol very often, feels that she does not need it.  Patient states has not tried any other medications to relieve her current symptoms.  Patient states she thinks she may have had a fever last night but was not able to check it because her thermometer is not working.  The history is provided by the patient.  Past Medical History:  Diagnosis Date   Adenomatous polyp    Arthritis    Carpal tunnel syndrome    COPD (chronic obstructive pulmonary disease) (HCC)    Depression    Diverticulitis    Fatty liver    GERD (gastroesophageal reflux disease)    not a problem now.   Graves disease    tx. radioactive Iodine many years ago.   Heart murmur    as a child   Hypercholesteremia    Hypothyroidism    post radioactive iodine to treat Graves' disease   Liver hemangioma    Overactive bladder    PONV (postoperative nausea and vomiting)    Postmenopausal    Shortness of breath    Sleep apnea    cpap use- not using in 2 weeks "not functioning well at this time"   Vulvar dysplasia    Patient Active Problem List   Diagnosis Date Noted   Osteoarthritis of right knee 05/16/2014   Allergic rhinitis, seasonal 11/20/2012   COPD with asthma ([place])  09/01/2012   Obstructive sleep apnea 09/01/2012   Past Surgical History:  Procedure Laterality Date   ABDOMINAL HYSTERECTOMY  1998   BREAST REDUCTION SURGERY  1981   CARPAL TUNNEL RELEASE Left    CATARACT EXTRACTION  2017   COLONOSCOPY W/ BIOPSIES AND POLYPECTOMY     benign   COLONOSCOPY WITH PROPOFOL N/A 10/14/2015   Procedure: COLONOSCOPY WITH PROPOFOL;  Surgeon: Garlan Fair, MD;  Location: [facility];  Service: Endoscopy;  Laterality: N/A;   KNEE ARTHROPLASTY Right 05/16/2014   Procedure: COMPUTER ASSISTED RIGHT TOTAL KNEE ARTHROPLASTY;  Surgeon: Marybelle Killings, MD;  Location: [facility];  Service: Orthopedics;  Laterality: Right;   KNEE ARTHROSCOPY Right 2014   right shoulder surgery  2009   TONSILLECTOMY  1985   TOTAL KNEE ARTHROPLASTY Right 05/16/2014   DR YATES   TUBAL LIGATION     VESICOVAGINAL FISTULA CLOSURE W/ TAH  1998   OB History   No obstetric history on file.    Home Medications    Prior to Admission medications   Medication Sig Start Date End Date Taking? Authorizing Provider  ADVAIR DISKUS 250-50 MCG/DOSE AEPB INHALE 1 PUFF BY MOUTH 2 TIMES A DAY 09/30/15   Young, Tarri Fuller D, MD  albuterol (PROVENTIL HFA;VENTOLIN HFA) 108 (90 BASE) MCG/ACT inhaler Inhale 2  puffs into the lungs every 4 (four) hours as needed. Patient taking differently: Inhale 2 puffs into the lungs every 4 (four) hours as needed for shortness of breath.  11/28/14   Baird Lyons D, MD  buPROPion (WELLBUTRIN SR) 150 MG 12 hr tablet Take 150 mg by mouth daily. 09/20/15   [provider]  calcium carbonate (TUMS - DOSED IN MG ELEMENTAL CALCIUM) 500 MG chewable tablet Chew 1 tablet by mouth as needed for heartburn.    [provider]  levothyroxine (SYNTHROID, LEVOTHROID) 88 MCG tablet 88 mcg daily. 07/07/16   [provider]  pravastatin (PRAVACHOL) 20 MG tablet Take 20 mg by mouth daily. 07/15/21   [provider]   Family History Family History  Problem Relation  Age of Onset   Heart attack Father    Alzheimer's disease Father    Lung cancer Mother    Asthma Daughter    Allergies Daughter    Social History Social History   Tobacco Use   Smoking status: Former    Packs/day: 0.50    Years: 33.00    Pack years: 16.50    Types: Cigarettes    Quit date: 08/24/2001    Years since quitting: 19.9   Smokeless tobacco: Never  Substance Use Topics   Alcohol use: Yes    Comment: wine a couple times a week   Drug use: No   Allergies   Hydrocodone, Breo ellipta [fluticasone furoate-vilanterol], Codeine, Oxycodone hcl, and Strawberry extract  Review of Systems Review of Systems Pertinent findings noted in history of present illness.   Physical Exam Triage Vital Signs ED Triage Vitals  Enc Vitals Group     BP 06/20/21 0827 (!) 147/82     Pulse Rate 06/20/21 0827 72     Resp 06/20/21 0827 18     Temp 06/20/21 0827 98.3 F (36.8 C)     Temp Source 06/20/21 0827 Oral     SpO2 06/20/21 0827 98 %     Weight --      Height --      Head Circumference --      Peak Flow --      Pain Score 06/20/21 0826 5     Pain Loc --      Pain Edu? --      Excl. in [place]? --   No data found.  Updated Vital Signs BP 137/83 (BP Location: Left Arm)   Pulse 62   Temp 98.6 F (37 C) (Oral)   Resp 18   SpO2 95%   Physical Exam Vitals and nursing note reviewed.  Constitutional:      General: She is not in acute distress.    Appearance: Normal appearance. She is not ill-appearing.  HENT:     Head: Normocephalic and atraumatic.     Salivary Glands: Right salivary gland is not diffusely enlarged or tender. Left salivary gland is not diffusely enlarged or tender.     Right Ear: Tympanic membrane, ear canal and external ear normal. No drainage. No middle ear effusion. There is no impacted cerumen. Tympanic membrane is not erythematous or bulging.     Left Ear: Tympanic membrane, ear canal and external ear normal. No drainage.  No middle ear effusion. There is no  impacted cerumen. Tympanic membrane is not erythematous or bulging.     Nose: Mucosal edema, congestion and rhinorrhea present. No nasal deformity or septal deviation. Rhinorrhea is clear.     Right Turbinates: Not enlarged, swollen  or pale.     Left Turbinates: Not enlarged, swollen or pale.     Right Sinus: No maxillary sinus tenderness or frontal sinus tenderness.     Left Sinus: No maxillary sinus tenderness or frontal sinus tenderness.     Mouth/Throat:     Lips: Pink. No lesions.     Mouth: Mucous membranes are moist. No oral lesions.     Pharynx: Uvula midline. Pharyngeal swelling, posterior oropharyngeal erythema and uvula swelling present.     Tonsils: No tonsillar exudate. 0 on the right. 0 on the left.  Eyes:     General: Lids are normal.        Right eye: No discharge.        Left eye: No discharge.     Extraocular Movements: Extraocular movements intact.     Conjunctiva/sclera: Conjunctivae normal.     Right eye: Right conjunctiva is not injected.     Left eye: Left conjunctiva is not injected.  Neck:     Trachea: Trachea and phonation normal.  Cardiovascular:     Rate and Rhythm: Normal rate and regular rhythm.     Pulses: Normal pulses.     Heart sounds: Normal heart sounds. No murmur heard.   No friction rub. No gallop.  Pulmonary:     Effort: Pulmonary effort is normal. No accessory muscle usage, prolonged expiration or respiratory distress.     Breath sounds: Normal breath sounds. No stridor, decreased air movement or transmitted upper airway sounds. No decreased breath sounds, wheezing, rhonchi or rales.  Chest:     Chest wall: No tenderness.  Musculoskeletal:        General: Normal range of motion.     Cervical back: Normal range of motion and neck supple. Normal range of motion.  Lymphadenopathy:     Cervical: No cervical adenopathy.  Skin:    General: Skin is warm and dry.     Findings: No erythema or rash.  Neurological:     General: No focal deficit  present.     Mental Status: She is alert and oriented to person, place, and time.  Psychiatric:        Mood and Affect: Mood normal.        Behavior: Behavior normal.    Visual Acuity Right Eye Distance:   Left Eye Distance:   Bilateral Distance:    Right Eye Near:   Left Eye Near:    Bilateral Near:     UC Couse / Diagnostics / Procedures:    EKG  Radiology No results found.  Procedures Procedures (including critical care time)  UC Diagnoses / Final Clinical Impressions(s)   I have reviewed the triage vital signs and the nursing notes.  Pertinent labs & imaging results that were available during my care of the patient were reviewed by me and considered in my medical decision making (see chart for details).   Final diagnoses:  Viral upper respiratory infection  Nasal congestion  Rhinorrhea  Acute pharyngitis, unspecified etiology  Chronic obstructive pulmonary disease, unspecified COPD type ([place])  Bilateral lower extremity edema   Patient advised to increase Advair to twice daily.  Albuterol renewed.  Atrovent nasal spray provided.  Hydrochlorothiazide provided patient request for lower extremity edema which is pitting.  Return precautions advised.  ED Prescriptions     Medication Sig Dispense Auth. Provider   albuterol (VENTOLIN HFA) 108 (90 Base) MCG/ACT inhaler Inhale 2 puffs into the lungs every 4 (four) hours as needed for  wheezing or shortness of breath ([place]). 18 g Lynden Oxford Scales, PA-C   fluticasone-salmeterol (ADVAIR DISKUS) 250-50 MCG/ACT AEPB INHALE 1 PUFF BY MOUTH 2 TIMES A DAY 60 each Lynden Oxford Scales, PA-C   ipratropium (ATROVENT) 0.06 % nasal spray Place 2 sprays into both nostrils 4 (four) times daily. As needed for nasal congestion, runny nose 15 mL Lynden Oxford Scales, PA-C   hydrochlorothiazide (HYDRODIURIL) 25 MG tablet Take 1 tablet (25 mg total) by mouth in the morning. 30 tablet Lynden Oxford Scales, PA-C      PDMP not  reviewed this encounter.  Pending results:  Labs Reviewed  COVID-19, FLU A+B AND RSV  POCT RAPID STREP A (OFFICE)    Medications Ordered in UC: Medications - No data to display  Disposition Upon Discharge:  Condition: stable for discharge home Home: take medications as prescribed; routine discharge instructions as discussed; follow up as advised.  Patient presented with an acute illness with associated systemic symptoms and significant discomfort requiring urgent management. In my opinion, this is a condition that a prudent lay person (someone who possesses an average knowledge of health and medicine) may potentially expect to result in complications if not addressed urgently such as respiratory distress, impairment of bodily function or dysfunction of bodily organs.   Routine symptom specific, illness specific and/or disease specific instructions were discussed with the patient and/or caregiver at length.   As such, the patient has been evaluated and assessed, work-up was performed and treatment was provided in alignment with urgent care protocols and evidence based medicine.  Patient/parent/caregiver has been advised that the patient may require follow up for further testing and treatment if the symptoms continue in spite of treatment, as clinically indicated and appropriate.  The patient was tested for COVID-19, Influenza and/or RSV, then the patient/parent/guardian was advised to isolate at home pending the results of his/her diagnostic coronavirus test and potentially longer if they're positive. I have also advised pt that if his/her COVID-19 test returns positive, it's recommended to self-isolate for at least 10 days after symptoms first appeared AND until fever-free for 24 hours without fever reducer AND other symptoms have improved or resolved. Discussed self-isolation recommendations as well as instructions for household member/close contacts as per the [facility] and [place] [facility], and also gave  patient the [place] packet with this information.  Patient/parent/caregiver has been advised to return to the [facility] or PCP in 3-5 days if no better; to PCP or the Emergency Department if new signs and symptoms develop, or if the current signs or symptoms continue to change or worsen for further workup, evaluation and treatment as clinically indicated and appropriate  The patient will follow up with their current PCP if and as advised. If the patient does not currently have a PCP we will assist them in obtaining one.   The patient may need specialty follow up if the symptoms continue, in spite of conservative treatment and management, for further workup, evaluation, consultation and treatment as clinically indicated and appropriate.  Patient/parent/caregiver verbalized understanding and agreement of plan as discussed.  All questions were addressed during visit.  Please see discharge instructions below for further details of plan.  Discharge Instructions:   Discharge Instructions      Your rapid strep test today was negative.  Throat culture will be performed per our protocol.  The result takes 3 to 5 days and will be posted to your MyChart.  If the result is positive, you will be contacted by phone and antibiotics  to be prescribed.  Your symptoms are most consistent with a viral upper respiratory illness.  The results of your respiratory virus testing will be posted to your MyChart.  If any of the results are positive, you will be contacted by phone and further recommendations such as antiviral medications or a note to extend your time out of work/school, if needed, will be provided to you.  Your lung exam today is reassuring.  Please do increase the frequency of Advair to twice daily.  As we discussed, some people find it helpful to inhale 1 puff of albuterol prior to inhaling Advair.  For your nasal congestion which is causing postnasal drip and likely triggering you to cough, please begin  Atrovent nasal spray, you can use 2 sprays in each nostril up to 4 times daily until your nasal passages are dry.  This nasal spray does not cause rebound congestion so feel free to use it whenever your nose is runny.  For your lower extremity edema, I have renewed prescription for hydrochlorothiazide 25 mg.  I recommend that you take it first thing in the morning as it will cause you to urinate more frequently for the first few weeks that you take it.  Please be sure to discuss this medication with your doctor the next time you visit with them to see if they recommend that you continue taking it.  Please remain home from work, school, public places until your symptoms are largely resolved without the use of antifever or over-the-counter medications.  Conservative care is also recommended at this time.  This includes rest, pushing clear fluids and activity as tolerated.  You may also noticed that your appetite is reduced, this is okay as long as they are drinking plenty of clear fluids.  Acetaminophen (Tylenol): This is a good fever reducer.  If there body temperature rises above 101.5 as measured with a thermometer, it is recommended that you give them 1,000 mg every 6-8 hours until they are temperature falls below 101.5, please not take more than 3,000 mg of acetaminophen either as a separate medication or as in ingredient in an over-the-counter cold/flu preparation within a 24-hour period  Ibuprofen  (Advil, Motrin): This is a good anti-inflammatory medication which addresses aches and pains and, to some degree, congestion in the nasal passages.  I recommend giving between 400 to 600 mg every 6-8 hours as needed.  Pseudoephedrine (Sudafed): This is a decongestant.  This medication has to be purchased from the pharmacist counter, I recommend giving 2 tablets, 60 mg, 2-3 times a day as needed to relieve runny nose and sinus drainage.  Guaifenesin (Robitussin, Mucinex): This is an expectorant.  This  helps break up chest congestion and loosen up thick nasal drainage making phlegm and drainage more liquid and therefore easier to remove.  I recommend being 400 mg three times daily as needed.  Dextromethorphan (any cough medicine with the letters "DM" added to it's name such as Robitussin DM): This is a cough suppressant.  This is often recommended to be taken at nighttime to suppress cough and help children sleep.  Give dosage as directed on the bottle.   Chloraseptic Throat Spray: Spray 5 sprays into affected area every 2 hours, hold for 15 seconds and either swallow or spit it out.  This is a excellent numbing medication because it is a spray, you can put it right where you needed and so sucking on a lozenge and numbing your entire mouth.  Based on my physical  exam findings and the history provided  today, I do not see any evidence of bacterial infection therefore treatment with antibiotics would be of no benefit.  Please follow-up within the next 3 to 5 days either with your primary care provider or urgent care if your symptoms do not resolve.  I have initiated a request to assist you in finding a primary care provider if you are unable to locate one by reaching out to family and friends in your community.        Lynden Oxford Scales, PA-C 07/31/21 [phone]

## 2021-07-31 NOTE — Discharge Instructions (Addendum)
Your rapid strep test today was negative.  Throat culture will be performed per our protocol.  The result takes 3 to 5 days and will be posted to your MyChart.  If the result is positive, you will be contacted by phone and antibiotics to be prescribed.  Your symptoms are most consistent with a viral upper respiratory illness.  The results of your respiratory virus testing will be posted to your MyChart.  If any of the results are positive, you will be contacted by phone and further recommendations such as antiviral medications or a note to extend your time out of work/school, if needed, will be provided to you.  Your lung exam today is reassuring.  Please do increase the frequency of Advair to twice daily.  As we discussed, some people find it helpful to inhale 1 puff of albuterol prior to inhaling Advair.  For your nasal congestion which is causing postnasal drip and likely triggering you to cough, please begin Atrovent nasal spray, you can use 2 sprays in each nostril up to 4 times daily until your nasal passages are dry.  This nasal spray does not cause rebound congestion so feel free to use it whenever your nose is runny.  For your lower extremity edema, I have renewed prescription for hydrochlorothiazide 25 mg.  I recommend that you take it first thing in the morning as it will cause you to urinate more frequently for the first few weeks that you take it.  Please be sure to discuss this medication with your doctor the next time you visit with them to see if they recommend that you continue taking it.  Please remain home from work, school, public places until your symptoms are largely resolved without the use of antifever or over-the-counter medications.  Conservative care is also recommended at this time.  This includes rest, pushing clear fluids and activity as tolerated.  You may also noticed that your appetite is reduced, this is okay as long as they are drinking plenty of clear  fluids.  Acetaminophen (Tylenol): This is a good fever reducer.  If there body temperature rises above 101.5 as measured with a thermometer, it is recommended that you give them 1,000 mg every 6-8 hours until they are temperature falls below 101.5, please not take more than 3,000 mg of acetaminophen either as a separate medication or as in ingredient in an over-the-counter cold/flu preparation within a 24-hour period  Ibuprofen  (Advil, Motrin): This is a good anti-inflammatory medication which addresses aches and pains and, to some degree, congestion in the nasal passages.  I recommend giving between 400 to 600 mg every 6-8 hours as needed.  Pseudoephedrine (Sudafed): This is a decongestant.  This medication has to be purchased from the pharmacist counter, I recommend giving 2 tablets, 60 mg, 2-3 times a day as needed to relieve runny nose and sinus drainage.  Guaifenesin (Robitussin, Mucinex): This is an expectorant.  This helps break up chest congestion and loosen up thick nasal drainage making phlegm and drainage more liquid and therefore easier to remove.  I recommend being 400 mg three times daily as needed.  Dextromethorphan (any cough medicine with the letters "DM" added to it's name such as Robitussin DM): This is a cough suppressant.  This is often recommended to be taken at nighttime to suppress cough and help children sleep.  Give dosage as directed on the bottle.   Chloraseptic Throat Spray: Spray 5 sprays into affected area every 2 hours, hold for 15  seconds and either swallow or spit it out.  This is a excellent numbing medication because it is a spray, you can put it right where you needed and so sucking on a lozenge and numbing your entire mouth.  Based on my physical exam findings and the history provided  today, I do not see any evidence of bacterial infection therefore treatment with antibiotics would be of no benefit.  Please follow-up within the next 3 to 5 days either with your  primary care provider or urgent care if your symptoms do not resolve.  I have initiated a request to assist you in finding a primary care provider if you are unable to locate one by reaching out to family and friends in your community.

## 2021-08-01 LAB — COVID-19, FLU A+B AND RSV
Influenza A, NAA: NOT DETECTED
Influenza B, NAA: NOT DETECTED
RSV, NAA: NOT DETECTED
SARS-CoV-2, NAA: NOT DETECTED

## 2021-08-03 LAB — CULTURE, GROUP A STREP (THRC)

## 2021-08-06 LAB — COVID-19, FLU A+B AND RSV

## 2021-08-08 DIAGNOSIS — E039 Hypothyroidism, unspecified: Secondary | ICD-10-CM | POA: Diagnosis not present

## 2021-08-08 DIAGNOSIS — R1032 Left lower quadrant pain: Secondary | ICD-10-CM | POA: Diagnosis not present

## 2021-08-08 DIAGNOSIS — K625 Hemorrhage of anus and rectum: Secondary | ICD-10-CM | POA: Diagnosis not present

## 2021-08-08 DIAGNOSIS — K5900 Constipation, unspecified: Secondary | ICD-10-CM | POA: Diagnosis not present

## 2021-08-09 DIAGNOSIS — K219 Gastro-esophageal reflux disease without esophagitis: Secondary | ICD-10-CM | POA: Diagnosis not present

## 2021-08-09 DIAGNOSIS — E039 Hypothyroidism, unspecified: Secondary | ICD-10-CM | POA: Diagnosis not present

## 2021-08-09 DIAGNOSIS — J45909 Unspecified asthma, uncomplicated: Secondary | ICD-10-CM | POA: Diagnosis not present

## 2021-08-09 DIAGNOSIS — J449 Chronic obstructive pulmonary disease, unspecified: Secondary | ICD-10-CM | POA: Diagnosis not present

## 2021-08-09 DIAGNOSIS — R69 Illness, unspecified: Secondary | ICD-10-CM | POA: Diagnosis not present

## 2021-08-09 DIAGNOSIS — M179 Osteoarthritis of knee, unspecified: Secondary | ICD-10-CM | POA: Diagnosis not present

## 2021-08-09 DIAGNOSIS — E7800 Pure hypercholesterolemia, unspecified: Secondary | ICD-10-CM | POA: Diagnosis not present

## 2021-08-09 DIAGNOSIS — I10 Essential (primary) hypertension: Secondary | ICD-10-CM | POA: Diagnosis not present

## 2021-09-04 DIAGNOSIS — G4733 Obstructive sleep apnea (adult) (pediatric): Secondary | ICD-10-CM | POA: Diagnosis not present

## 2021-09-17 DIAGNOSIS — M109 Gout, unspecified: Secondary | ICD-10-CM | POA: Diagnosis not present

## 2021-09-26 DIAGNOSIS — R69 Illness, unspecified: Secondary | ICD-10-CM | POA: Diagnosis not present

## 2021-09-26 DIAGNOSIS — K5900 Constipation, unspecified: Secondary | ICD-10-CM | POA: Diagnosis not present

## 2021-09-26 DIAGNOSIS — R1030 Lower abdominal pain, unspecified: Secondary | ICD-10-CM | POA: Diagnosis not present

## 2021-09-26 DIAGNOSIS — J449 Chronic obstructive pulmonary disease, unspecified: Secondary | ICD-10-CM | POA: Diagnosis not present

## 2021-09-26 DIAGNOSIS — E039 Hypothyroidism, unspecified: Secondary | ICD-10-CM | POA: Diagnosis not present

## 2021-10-20 DIAGNOSIS — Z86010 Personal history of colonic polyps: Secondary | ICD-10-CM | POA: Diagnosis not present

## 2021-10-20 DIAGNOSIS — K644 Residual hemorrhoidal skin tags: Secondary | ICD-10-CM | POA: Diagnosis not present

## 2021-10-20 DIAGNOSIS — R1030 Lower abdominal pain, unspecified: Secondary | ICD-10-CM | POA: Diagnosis not present

## 2021-10-20 DIAGNOSIS — K5900 Constipation, unspecified: Secondary | ICD-10-CM | POA: Diagnosis not present

## 2021-10-22 DIAGNOSIS — K5900 Constipation, unspecified: Secondary | ICD-10-CM | POA: Diagnosis not present

## 2021-10-22 DIAGNOSIS — E785 Hyperlipidemia, unspecified: Secondary | ICD-10-CM | POA: Diagnosis not present

## 2021-10-22 DIAGNOSIS — R32 Unspecified urinary incontinence: Secondary | ICD-10-CM | POA: Diagnosis not present

## 2021-10-22 DIAGNOSIS — I951 Orthostatic hypotension: Secondary | ICD-10-CM | POA: Diagnosis not present

## 2021-10-22 DIAGNOSIS — R69 Illness, unspecified: Secondary | ICD-10-CM | POA: Diagnosis not present

## 2021-10-22 DIAGNOSIS — R030 Elevated blood-pressure reading, without diagnosis of hypertension: Secondary | ICD-10-CM | POA: Diagnosis not present

## 2021-10-22 DIAGNOSIS — Z7951 Long term (current) use of inhaled steroids: Secondary | ICD-10-CM | POA: Diagnosis not present

## 2021-10-22 DIAGNOSIS — Z6832 Body mass index (BMI) 32.0-32.9, adult: Secondary | ICD-10-CM | POA: Diagnosis not present

## 2021-10-22 DIAGNOSIS — E890 Postprocedural hypothyroidism: Secondary | ICD-10-CM | POA: Diagnosis not present

## 2021-10-22 DIAGNOSIS — E669 Obesity, unspecified: Secondary | ICD-10-CM | POA: Diagnosis not present

## 2021-10-22 DIAGNOSIS — J449 Chronic obstructive pulmonary disease, unspecified: Secondary | ICD-10-CM | POA: Diagnosis not present

## 2021-10-31 DIAGNOSIS — G4733 Obstructive sleep apnea (adult) (pediatric): Secondary | ICD-10-CM | POA: Diagnosis not present

## 2021-10-31 DIAGNOSIS — G4700 Insomnia, unspecified: Secondary | ICD-10-CM | POA: Diagnosis not present

## 2021-11-24 DIAGNOSIS — K5900 Constipation, unspecified: Secondary | ICD-10-CM | POA: Diagnosis not present

## 2021-11-24 DIAGNOSIS — Z86010 Personal history of colonic polyps: Secondary | ICD-10-CM | POA: Diagnosis not present

## 2022-02-15 DIAGNOSIS — H6991 Unspecified Eustachian tube disorder, right ear: Secondary | ICD-10-CM | POA: Diagnosis not present

## 2022-03-18 DIAGNOSIS — G4733 Obstructive sleep apnea (adult) (pediatric): Secondary | ICD-10-CM | POA: Diagnosis not present

## 2022-03-18 DIAGNOSIS — K219 Gastro-esophageal reflux disease without esophagitis: Secondary | ICD-10-CM | POA: Diagnosis not present

## 2022-03-18 DIAGNOSIS — Z0000 Encounter for general adult medical examination without abnormal findings: Secondary | ICD-10-CM | POA: Diagnosis not present

## 2022-03-18 DIAGNOSIS — D1803 Hemangioma of intra-abdominal structures: Secondary | ICD-10-CM | POA: Diagnosis not present

## 2022-03-18 DIAGNOSIS — K5900 Constipation, unspecified: Secondary | ICD-10-CM | POA: Diagnosis not present

## 2022-03-18 DIAGNOSIS — I10 Essential (primary) hypertension: Secondary | ICD-10-CM | POA: Diagnosis not present

## 2022-03-18 DIAGNOSIS — J449 Chronic obstructive pulmonary disease, unspecified: Secondary | ICD-10-CM | POA: Diagnosis not present

## 2022-03-18 DIAGNOSIS — R32 Unspecified urinary incontinence: Secondary | ICD-10-CM | POA: Diagnosis not present

## 2022-03-18 DIAGNOSIS — E039 Hypothyroidism, unspecified: Secondary | ICD-10-CM | POA: Diagnosis not present

## 2022-03-18 DIAGNOSIS — R69 Illness, unspecified: Secondary | ICD-10-CM | POA: Diagnosis not present

## 2022-03-18 DIAGNOSIS — G3184 Mild cognitive impairment, so stated: Secondary | ICD-10-CM | POA: Diagnosis not present

## 2022-03-18 DIAGNOSIS — E7800 Pure hypercholesterolemia, unspecified: Secondary | ICD-10-CM | POA: Diagnosis not present

## 2022-04-15 DIAGNOSIS — I10 Essential (primary) hypertension: Secondary | ICD-10-CM | POA: Diagnosis not present

## 2022-04-15 DIAGNOSIS — K219 Gastro-esophageal reflux disease without esophagitis: Secondary | ICD-10-CM | POA: Diagnosis not present

## 2022-04-15 DIAGNOSIS — J449 Chronic obstructive pulmonary disease, unspecified: Secondary | ICD-10-CM | POA: Diagnosis not present

## 2022-04-15 DIAGNOSIS — E039 Hypothyroidism, unspecified: Secondary | ICD-10-CM | POA: Diagnosis not present

## 2022-04-15 DIAGNOSIS — E7800 Pure hypercholesterolemia, unspecified: Secondary | ICD-10-CM | POA: Diagnosis not present

## 2022-04-22 DIAGNOSIS — R945 Abnormal results of liver function studies: Secondary | ICD-10-CM | POA: Diagnosis not present

## 2022-06-05 DIAGNOSIS — Z961 Presence of intraocular lens: Secondary | ICD-10-CM | POA: Diagnosis not present

## 2022-06-05 DIAGNOSIS — Z0100 Encounter for examination of eyes and vision without abnormal findings: Secondary | ICD-10-CM | POA: Diagnosis not present

## 2022-06-05 DIAGNOSIS — H524 Presbyopia: Secondary | ICD-10-CM | POA: Diagnosis not present

## 2022-06-12 DIAGNOSIS — Z01411 Encounter for gynecological examination (general) (routine) with abnormal findings: Secondary | ICD-10-CM | POA: Diagnosis not present

## 2022-06-12 DIAGNOSIS — Z6828 Body mass index (BMI) 28.0-28.9, adult: Secondary | ICD-10-CM | POA: Diagnosis not present

## 2022-06-12 DIAGNOSIS — Z1231 Encounter for screening mammogram for malignant neoplasm of breast: Secondary | ICD-10-CM | POA: Diagnosis not present

## 2022-06-12 DIAGNOSIS — Z0000 Encounter for general adult medical examination without abnormal findings: Secondary | ICD-10-CM | POA: Diagnosis not present

## 2022-06-12 DIAGNOSIS — M8580 Other specified disorders of bone density and structure, unspecified site: Secondary | ICD-10-CM | POA: Diagnosis not present

## 2022-06-12 DIAGNOSIS — Z01419 Encounter for gynecological examination (general) (routine) without abnormal findings: Secondary | ICD-10-CM | POA: Diagnosis not present

## 2022-06-12 DIAGNOSIS — R32 Unspecified urinary incontinence: Secondary | ICD-10-CM | POA: Diagnosis not present

## 2022-06-12 DIAGNOSIS — Z124 Encounter for screening for malignant neoplasm of cervix: Secondary | ICD-10-CM | POA: Diagnosis not present

## 2022-07-24 DIAGNOSIS — K635 Polyp of colon: Secondary | ICD-10-CM | POA: Diagnosis not present

## 2022-07-24 DIAGNOSIS — K5730 Diverticulosis of large intestine without perforation or abscess without bleeding: Secondary | ICD-10-CM | POA: Diagnosis not present

## 2022-07-24 DIAGNOSIS — K648 Other hemorrhoids: Secondary | ICD-10-CM | POA: Diagnosis not present

## 2022-07-24 DIAGNOSIS — Z09 Encounter for follow-up examination after completed treatment for conditions other than malignant neoplasm: Secondary | ICD-10-CM | POA: Diagnosis not present

## 2022-07-24 DIAGNOSIS — Z86010 Personal history of colonic polyps: Secondary | ICD-10-CM | POA: Diagnosis not present

## 2022-07-28 DIAGNOSIS — K635 Polyp of colon: Secondary | ICD-10-CM | POA: Diagnosis not present

## 2022-09-17 DIAGNOSIS — G4733 Obstructive sleep apnea (adult) (pediatric): Secondary | ICD-10-CM | POA: Diagnosis not present

## 2022-09-17 DIAGNOSIS — E663 Overweight: Secondary | ICD-10-CM | POA: Diagnosis not present

## 2022-10-01 DIAGNOSIS — J449 Chronic obstructive pulmonary disease, unspecified: Secondary | ICD-10-CM | POA: Diagnosis not present

## 2022-10-01 DIAGNOSIS — D1803 Hemangioma of intra-abdominal structures: Secondary | ICD-10-CM | POA: Diagnosis not present

## 2022-10-01 DIAGNOSIS — R69 Illness, unspecified: Secondary | ICD-10-CM | POA: Diagnosis not present

## 2022-10-01 DIAGNOSIS — R413 Other amnesia: Secondary | ICD-10-CM | POA: Diagnosis not present

## 2022-10-01 DIAGNOSIS — E039 Hypothyroidism, unspecified: Secondary | ICD-10-CM | POA: Diagnosis not present

## 2022-10-01 DIAGNOSIS — K5900 Constipation, unspecified: Secondary | ICD-10-CM | POA: Diagnosis not present

## 2022-10-14 DIAGNOSIS — G4733 Obstructive sleep apnea (adult) (pediatric): Secondary | ICD-10-CM | POA: Diagnosis not present

## 2022-10-14 DIAGNOSIS — G4710 Hypersomnia, unspecified: Secondary | ICD-10-CM | POA: Diagnosis not present

## 2022-10-14 DIAGNOSIS — E663 Overweight: Secondary | ICD-10-CM | POA: Diagnosis not present

## 2022-10-15 DIAGNOSIS — J449 Chronic obstructive pulmonary disease, unspecified: Secondary | ICD-10-CM | POA: Diagnosis not present

## 2022-10-15 DIAGNOSIS — J45909 Unspecified asthma, uncomplicated: Secondary | ICD-10-CM | POA: Diagnosis not present

## 2022-10-15 DIAGNOSIS — K219 Gastro-esophageal reflux disease without esophagitis: Secondary | ICD-10-CM | POA: Diagnosis not present

## 2022-10-15 DIAGNOSIS — I10 Essential (primary) hypertension: Secondary | ICD-10-CM | POA: Diagnosis not present

## 2022-10-15 DIAGNOSIS — E7800 Pure hypercholesterolemia, unspecified: Secondary | ICD-10-CM | POA: Diagnosis not present

## 2022-10-15 DIAGNOSIS — E039 Hypothyroidism, unspecified: Secondary | ICD-10-CM | POA: Diagnosis not present

## 2022-12-17 ENCOUNTER — Ambulatory Visit: Payer: Medicare HMO | Admitting: Diagnostic Neuroimaging

## 2022-12-17 ENCOUNTER — Encounter: Payer: Self-pay | Admitting: Diagnostic Neuroimaging

## 2022-12-17 ENCOUNTER — Telehealth: Payer: Self-pay | Admitting: Diagnostic Neuroimaging

## 2022-12-17 VITALS — BP 122/74 | HR 80 | Ht 61.0 in | Wt 156.2 lb

## 2022-12-17 DIAGNOSIS — R413 Other amnesia: Secondary | ICD-10-CM | POA: Diagnosis not present

## 2022-12-17 NOTE — Patient Instructions (Addendum)
MILD MEMORY LOSS (MMSE 29/30; no changes in ADLs; ?MCI vs normal aging) - check B12 level - repeat MRI brain, CRP, ESR (with left sided headaches) - safety / supervision issues reviewed - daily physical activity / exercise (at least 15-30 minutes) - eat more plants / vegetables - increase social activities, brain stimulation, games, puzzles, hobbies, crafts, arts, music - aim for at least 7-8 hours sleep per night (or more) - avoid smoking and alcohol - caution with medications, finances, driving

## 2022-12-17 NOTE — Telephone Encounter (Signed)
sent to [facility] they obtain [employer] medicare auth [id number] 

## 2022-12-17 NOTE — Progress Notes (Signed)
[facility]  PATIENT: Jennifer Barker DOB: 14-Aug-1951  REFERRING CLINICIAN: Georgann Housekeeper, MD  HISTORY FROM: patient  REASON FOR VISIT: new consult    HISTORICAL  CHIEF COMPLAINT:  Chief Complaint  Patient presents with   Follow-up    Patient in room #6 and alone. Patient states she been having some memory issues.    HISTORY OF PRESENT ILLNESS:   UPDATE (12/17/22, VRP): Since last visit, more memory loss. Symptoms are progressive. More short term recall issues. 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 No other alleviating or aggravating factors. Major changes in ADLs. Living alone. Driving and shopping no issues.  PRIOR HPI ([id number]): 72 year old right-handed female here for evaluation of memory loss. Patient has history of asthma and Graves' disease. For past 1 year she has had increasing short-term memory, cognitive difficulty and word finding difficulties. She has missed appointments due to forgetting. She is having more trouble staying organized and problem solving. In fact patient had to leave her last job due to these cognitive difficulties. Patient is planning to start a new job that will have less cognitive demands.   Patient's father had Alzheimer's disease and patient is concerned about similar diagnosis. Patient has had depression and stress issues for many years, was previously on Prozac for a number of years, and now on Wellbutrin for the past one year. Patient also has obstructive sleep apnea and is on CPAP for the past 2 years.    REVIEW OF SYSTEMS: Full 14 system review of systems performed and negative with exception of: as per HPI.  ALLERGIES: Allergies  Allergen Reactions   Hydrocodone Nausea And Vomiting   Breo Ellipta [Fluticasone Furoate-Vilanterol]     Light headed; dizzy spells, nasuea   Codeine     vomiting   Oxycodone Hcl     vomiting   Strawberry Extract Hives    HOME  MEDICATIONS: Outpatient Medications Prior to Visit  Medication Sig Dispense Refill   albuterol (VENTOLIN HFA) 108 (90 Base) MCG/ACT inhaler Inhale 2 puffs into the lungs every 4 (four) hours as needed for wheezing or shortness of breath (Cough). 18 g 1   buPROPion (WELLBUTRIN XL) 300 MG 24 hr tablet Take 300 mg by mouth daily.     calcium carbonate (TUMS - DOSED IN MG ELEMENTAL CALCIUM) 500 MG chewable tablet Chew 1 tablet by mouth as needed for heartburn.     cholecalciferol (VITAMIN D3) 25 MCG (1000 UNIT) tablet Take 1,000 Units by mouth daily.     levothyroxine (SYNTHROID, LEVOTHROID) 88 MCG tablet 88 mcg daily.  2   senna-docusate (SENOKOT-S) 8.6-50 MG tablet Take 2 tablets by mouth at bedtime as needed for mild constipation.     SYMBICORT 160-4.5 MCG/ACT inhaler Inhale 2 puffs into the lungs 2 (two) times daily.     Turmeric 500 MG CAPS Take 500 mg by mouth as needed.     buPROPion (WELLBUTRIN SR) 150 MG 12 hr tablet Take 150 mg by mouth daily. (Patient not taking: Reported on 12/17/2022)  0   fluticasone-salmeterol (ADVAIR DISKUS) 250-50 MCG/ACT AEPB INHALE 1 PUFF BY MOUTH 2 TIMES A DAY (Patient not taking: Reported on 12/17/2022) 60 each 2   No facility-administered medications prior to visit.    PAST MEDICAL HISTORY: Past Medical History:  Diagnosis Date   Adenomatous polyp    Arthritis    Carpal tunnel syndrome    COPD (chronic obstructive pulmonary disease)  Depression    Diverticulitis    Fatty liver    GERD (gastroesophageal reflux disease)    not a problem now.   Graves disease    tx. radioactive Iodine many years ago.   Heart murmur    as a child   Hypercholesteremia    Hypothyroidism    post radioactive iodine to treat Graves' disease   Liver hemangioma    Overactive bladder    PONV (postoperative nausea and vomiting)    Postmenopausal    Shortness of breath    Sleep apnea    cpap use- not using in 2 weeks "not functioning well at this time"   Vulvar  dysplasia     PAST SURGICAL HISTORY: Past Surgical History:  Procedure Laterality Date   ABDOMINAL HYSTERECTOMY  1998   BREAST REDUCTION SURGERY  1981   CARPAL TUNNEL RELEASE Left    CATARACT EXTRACTION  2017   COLONOSCOPY W/ BIOPSIES AND POLYPECTOMY     benign   COLONOSCOPY WITH PROPOFOL N/A 10/14/2015   Procedure: COLONOSCOPY WITH PROPOFOL;  Surgeon: Charolett Bumpers, MD;  Location: [facility];  Service: Endoscopy;  Laterality: N/A;   KNEE ARTHROPLASTY Right 05/16/2014   Procedure: COMPUTER ASSISTED RIGHT TOTAL KNEE ARTHROPLASTY;  Surgeon: Eldred Manges, MD;  Location: [facility] OR;  Service: Orthopedics;  Laterality: Right;   KNEE ARTHROSCOPY Right 2014   right shoulder surgery  2009   TONSILLECTOMY  1985   TOTAL KNEE ARTHROPLASTY Right 05/16/2014   DR YATES   TUBAL LIGATION     VESICOVAGINAL FISTULA CLOSURE W/ TAH  1998    FAMILY HISTORY: Family History  Problem Relation Age of Onset   Heart attack Father    Alzheimer's disease Father    Lung cancer Mother    Asthma Daughter    Allergies Daughter     SOCIAL HISTORY:  Social History   Socioeconomic History   Marital status: Divorced    Spouse name: Not on file   Number of children: 1   Years of education: Not on file   Highest education level: Not on file  Occupational History   Occupation: Airline pilot  Tobacco Use   Smoking status: Former    Packs/day: 0.50    Years: 33.00    Additional pack years: 0.00    Total pack years: 16.50    Types: Cigarettes    Quit date: 08/24/2001    Years since quitting: 21.3   Smokeless tobacco: Never  Substance and Sexual Activity   Alcohol use: Yes    Comment: wine a couple times a week   Drug use: No   Sexual activity: Not on file  Other Topics Concern   Not on file  Social History Narrative   Lives alone at home   Caffeine- coffee, 1 cup daily, occas soda   Social Determinants of Health   Financial Resource Strain: Not on file  Food Insecurity: Not on file   Transportation Needs: Not on file  Physical Activity: Not on file  Stress: Not on file  Social Connections: Not on file  Intimate Partner Violence: Not on file     PHYSICAL EXAM  GENERAL EXAM/CONSTITUTIONAL: Vitals:  Vitals:   12/17/22 1302  BP: 122/74  Pulse: 80  Weight: 156 lb 3.2 oz (70.9 kg)  Height:  (1.549 m)   Body mass index is 29.51 kg/m. No results found.  Patient is in no distress; well developed, nourished and groomed; neck is supple  CARDIOVASCULAR: Examination of carotid  arteries is normal; no carotid bruits Regular rate and rhythm, no murmurs Examination of peripheral vascular system by observation and palpation is normal  EYES: Ophthalmoscopic exam of optic discs and posterior segments is normal; no papilledema or hemorrhages  MUSCULOSKELETAL: Gait, strength, tone, movements noted in Neurologic exam below  NEUROLOGIC: MENTAL STATUS:     12/17/2022    1:12 PM 07/08/2016    8:23 AM  MMSE - Mini Mental State Exam  Orientation to time 5 5  Orientation to Place 5 5  Registration 3 3  Attention/ Calculation 5 5  Recall 3 3  Language- name 2 objects 2 2  Language- repeat 1 1  Language- follow 3 step command 2 3  Language- read & follow direction 1 1  Write a sentence 1 1  Copy design 1 1  Total score 29 30   awake, alert, oriented to person, place and time recent and remote memory intact normal attention and concentration language fluent, comprehension intact, naming intact,  fund of knowledge appropriate  CRANIAL NERVE:  2nd - no papilledema on fundoscopic exam 2nd, 3rd, 4th, 6th - pupils equal and reactive to light, visual fields full to confrontation, extraocular muscles intact, no nystagmus 5th - facial sensation symmetric 7th - facial strength symmetric 8th - hearing intact 9th - palate elevates symmetrically, uvula midline 11th - shoulder shrug symmetric 12th - tongue protrusion midline  MOTOR:  normal bulk and tone, full  strength in the BUE, BLE  SENSORY:  normal and symmetric to light touch, temperature, vibration  COORDINATION:  finger-nose-finger, fine finger movements normal  REFLEXES:  deep tendon reflexes trace and symmetric  GAIT/STATION:  narrow based gait    DIAGNOSTIC DATA (LABS, IMAGING, TESTING) - I reviewed patient records, labs, notes, testing and imaging myself where available.  Lab Results  Component Value Date   WBC 8.0 06/28/2019   HGB 13.8 06/28/2019   HCT 41.6 06/28/2019   MCV 93.5 06/28/2019   PLT 272 06/28/2019      Component Value Date/Time   NA 141 06/28/2019 2241   K 4.1 06/28/2019 2241   CL 106 06/28/2019 2241   CO2 26 06/28/2019 2241   GLUCOSE 104 (H) 06/28/2019 2241   BUN 9 06/28/2019 2241   CREATININE 0.90 06/28/2019 2241   CALCIUM 10.1 06/28/2019 2241   PROT 6.5 06/28/2019 2241   ALBUMIN 3.7 06/28/2019 2241   AST 47 (H) 06/28/2019 2241   ALT 46 (H) 06/28/2019 2241   ALKPHOS 144 (H) 06/28/2019 2241   BILITOT 0.6 06/28/2019 2241   GFRNONAA >60 06/28/2019 2241   GFRAA >60 06/28/2019 2241   No results found for: "CHOL", "HDL", "LDLCALC", "LDLDIRECT", "TRIG", "CHOLHDL" No results found for: "HGBA1C" Lab Results  Component Value Date   VITAMINB12 264 07/08/2016   No results found for: "TSH"   07/21/16 Normal MRI brain (without).     ASSESSMENT AND PLAN  72 y.o. year old female here with one year of progressive cognitive difficulties including short-term memory loss, confusion, word finding difficulties and general problem solving impairment. Also with comorbid history of depression, stress, obesity, thyroid disease and sleep apnea.  Dx:  1. Memory loss     PLAN:  MILD MEMORY LOSS (MMSE 29/30; no changes in ADLs; ?MCI vs normal aging) - check B12 level - repeat MRI brain, CRP, ESR (with left sided headaches) - safety / supervision issues reviewed - daily physical activity / exercise (at least 15-30 minutes) - eat more plants /  vegetables -  increase social activities, brain stimulation, games, puzzles, hobbies, crafts, arts, music - aim for at least 7-8 hours sleep per night (or more) - avoid smoking and alcohol - caution with medications, finances, driving  Orders Placed This Encounter  Procedures   MR BRAIN WO CONTRAST   Vitamin B12   C-reactive Protein   Sedimentation Rate   Return for pending if symptoms worsen or fail to improve, pending test results.    Suanne Marker, MD 12/17/2022, 1:52 PM Certified in Neurology, Neurophysiology and Neuroimaging  [facility] [address], [address] [place], [place] [address] [phone]

## 2022-12-18 LAB — SEDIMENTATION RATE: Sed Rate: 3 mm/hr (ref 0–40)

## 2022-12-18 LAB — C-REACTIVE PROTEIN: CRP: <1 mg/L (ref 0–10)

## 2022-12-18 LAB — VITAMIN B12: Vitamin B-12: 353 pg/mL (ref 232–1245)

## 2022-12-23 DIAGNOSIS — F419 Anxiety disorder, unspecified: Secondary | ICD-10-CM | POA: Diagnosis not present

## 2022-12-23 DIAGNOSIS — Z87892 Personal history of anaphylaxis: Secondary | ICD-10-CM | POA: Diagnosis not present

## 2022-12-23 DIAGNOSIS — E890 Postprocedural hypothyroidism: Secondary | ICD-10-CM | POA: Diagnosis not present

## 2022-12-23 DIAGNOSIS — K5900 Constipation, unspecified: Secondary | ICD-10-CM | POA: Diagnosis not present

## 2022-12-23 DIAGNOSIS — M109 Gout, unspecified: Secondary | ICD-10-CM | POA: Diagnosis not present

## 2022-12-23 DIAGNOSIS — F17210 Nicotine dependence, cigarettes, uncomplicated: Secondary | ICD-10-CM | POA: Diagnosis not present

## 2022-12-23 DIAGNOSIS — I10 Essential (primary) hypertension: Secondary | ICD-10-CM | POA: Diagnosis not present

## 2022-12-23 DIAGNOSIS — J4489 Other specified chronic obstructive pulmonary disease: Secondary | ICD-10-CM | POA: Diagnosis not present

## 2022-12-23 DIAGNOSIS — Z7951 Long term (current) use of inhaled steroids: Secondary | ICD-10-CM | POA: Diagnosis not present

## 2022-12-23 DIAGNOSIS — F324 Major depressive disorder, single episode, in partial remission: Secondary | ICD-10-CM | POA: Diagnosis not present

## 2022-12-23 DIAGNOSIS — Z008 Encounter for other general examination: Secondary | ICD-10-CM | POA: Diagnosis not present

## 2022-12-23 DIAGNOSIS — E785 Hyperlipidemia, unspecified: Secondary | ICD-10-CM | POA: Diagnosis not present

## 2022-12-23 DIAGNOSIS — M1990 Unspecified osteoarthritis, unspecified site: Secondary | ICD-10-CM | POA: Diagnosis not present

## 2022-12-24 ENCOUNTER — Ambulatory Visit
Admission: RE | Admit: 2022-12-24 | Discharge: 2022-12-24 | Disposition: A | Discharge status: Home / Self Care | Payer: Medicare HMO | Source: Ambulatory Visit | Attending: Diagnostic Neuroimaging | Admitting: Diagnostic Neuroimaging

## 2022-12-24 DIAGNOSIS — R413 Other amnesia: Secondary | ICD-10-CM

## 2023-03-22 DIAGNOSIS — E039 Hypothyroidism, unspecified: Secondary | ICD-10-CM | POA: Diagnosis not present

## 2023-03-22 DIAGNOSIS — F331 Major depressive disorder, recurrent, moderate: Secondary | ICD-10-CM | POA: Diagnosis not present

## 2023-03-22 DIAGNOSIS — G3184 Mild cognitive impairment, so stated: Secondary | ICD-10-CM | POA: Diagnosis not present

## 2023-03-22 DIAGNOSIS — D1803 Hemangioma of intra-abdominal structures: Secondary | ICD-10-CM | POA: Diagnosis not present

## 2023-03-22 DIAGNOSIS — J449 Chronic obstructive pulmonary disease, unspecified: Secondary | ICD-10-CM | POA: Diagnosis not present

## 2023-03-22 DIAGNOSIS — G4733 Obstructive sleep apnea (adult) (pediatric): Secondary | ICD-10-CM | POA: Diagnosis not present

## 2023-03-22 DIAGNOSIS — M8580 Other specified disorders of bone density and structure, unspecified site: Secondary | ICD-10-CM | POA: Diagnosis not present

## 2023-03-22 DIAGNOSIS — Z9989 Dependence on other enabling machines and devices: Secondary | ICD-10-CM | POA: Diagnosis not present

## 2023-03-22 DIAGNOSIS — K5900 Constipation, unspecified: Secondary | ICD-10-CM | POA: Diagnosis not present

## 2023-03-22 DIAGNOSIS — Z1331 Encounter for screening for depression: Secondary | ICD-10-CM | POA: Diagnosis not present

## 2023-03-22 DIAGNOSIS — E785 Hyperlipidemia, unspecified: Secondary | ICD-10-CM | POA: Diagnosis not present

## 2023-03-22 DIAGNOSIS — Z0000 Encounter for general adult medical examination without abnormal findings: Secondary | ICD-10-CM | POA: Diagnosis not present

## 2023-04-20 DIAGNOSIS — R945 Abnormal results of liver function studies: Secondary | ICD-10-CM | POA: Diagnosis not present

## 2023-06-15 DIAGNOSIS — H26491 Other secondary cataract, right eye: Secondary | ICD-10-CM | POA: Diagnosis not present

## 2023-06-15 DIAGNOSIS — Z961 Presence of intraocular lens: Secondary | ICD-10-CM | POA: Diagnosis not present

## 2023-06-15 DIAGNOSIS — H04123 Dry eye syndrome of bilateral lacrimal glands: Secondary | ICD-10-CM | POA: Diagnosis not present

## 2023-06-24 DIAGNOSIS — E785 Hyperlipidemia, unspecified: Secondary | ICD-10-CM | POA: Diagnosis not present

## 2023-06-24 DIAGNOSIS — R202 Paresthesia of skin: Secondary | ICD-10-CM | POA: Diagnosis not present

## 2023-06-24 DIAGNOSIS — E039 Hypothyroidism, unspecified: Secondary | ICD-10-CM | POA: Diagnosis not present

## 2023-06-25 ENCOUNTER — Other Ambulatory Visit: Payer: Self-pay | Admitting: Internal Medicine

## 2023-06-25 DIAGNOSIS — Z87891 Personal history of nicotine dependence: Secondary | ICD-10-CM

## 2023-07-08 ENCOUNTER — Ambulatory Visit
Admission: RE | Admit: 2023-07-08 | Discharge: 2023-07-08 | Disposition: A | Discharge status: Home / Self Care | Payer: Medicare HMO | Source: Ambulatory Visit | Attending: Internal Medicine | Admitting: Internal Medicine

## 2023-07-08 DIAGNOSIS — Z87891 Personal history of nicotine dependence: Secondary | ICD-10-CM | POA: Diagnosis not present

## 2023-07-21 ENCOUNTER — Other Ambulatory Visit (HOSPITAL_COMMUNITY): Payer: Self-pay | Admitting: Internal Medicine

## 2023-07-21 DIAGNOSIS — I2510 Atherosclerotic heart disease of native coronary artery without angina pectoris: Secondary | ICD-10-CM

## 2023-08-03 ENCOUNTER — Ambulatory Visit (HOSPITAL_COMMUNITY)
Admission: RE | Admit: 2023-08-03 | Discharge: 2023-08-03 | Disposition: A | Discharge status: Home / Self Care | Payer: Medicare HMO | Source: Ambulatory Visit | Attending: Internal Medicine | Admitting: Internal Medicine

## 2023-08-03 ENCOUNTER — Encounter (HOSPITAL_COMMUNITY): Payer: Self-pay

## 2023-08-03 DIAGNOSIS — I2510 Atherosclerotic heart disease of native coronary artery without angina pectoris: Secondary | ICD-10-CM | POA: Insufficient documentation

## 2023-09-21 NOTE — Progress Notes (Unsigned)
Cardiology Office Note:  .   Date:  09/22/2023 ID:  Jennifer Barker, DOB 19-Jun-1951, MRN [id number] PCP: Georgann Housekeeper, MD [facility] Providers Cardiologist:  None   Patient Profile: .      PMH Aortic atherosclerosis Coronary artery disease CT Calcium score 08/03/23 CAC score 456 (91st percentile) LM 0, LAD 95, LCx 0, RCA 361 Hypertension Mixed hyperlipidemia COPD Morbid obesity Former tobacco abuse Quit smoking 2019 Vapes non-nicotine substance Fatty liver Hypothyroidism       History of Present Illness: .   Jennifer Barker is a very pleasant 73 y.o. female  who is here today for new patient consult for elevated CT calcium score. She reports familial history of high cholesterol and heart disease and a personal history of hyperlipidemia.  LDL was 204 as of October 31st, significantly above the desired level of under 100. She was not aware of this significant elevation in the past which is likely familial hyperlipidemia. She is currently on Crestor. She quit smoking in 2019 and now vapes without nicotine. Blood pressure, which was historically low, has increased with age but she is not on anti-hypertensive therapy. Despite working full-time, she tries to maintain an active lifestyle with water aerobics and walking, although she admits water aerobics is only a few times per month in the winter months. She has been on Weight Watchers and successfully lost 78 pounds, but has recently gained back 25. Admits to healthy eating, but "gets off track." Says she can do better when she is focused. She admits to daily alcohol consumption, typically one or two glasses of wine.  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  She lives in the same neighborhood as her daughter who has a 73-year-old granddaughter that she enjoys spending time with.  Her father had 5 vessel CABG and lots of heart issues, possibly starting in his 58s.  She reports occasional chest heaviness which resolves after  a few seconds or minutes.  She denies palpitations, shortness of breath, orthopnea, PND, edema, presyncope, or syncope.  Family history: Her family history includes Allergies in her daughter; Alzheimer's disease in her father; Asthma in her daughter; Heart attack in her father; Lung cancer in her mother.    ASCVD Risk Score:  ASCVD (Atherosclerotic Cardiovascular Disease) Risk Algorithm including Known ASCVD from AHA/ACC from [email or web address]  on 09/21/2023 ** All calculations should be rechecked by clinician prior to use **  RESULT SUMMARY:   High-intensity statin recommended; if not tolerated or not a candidate, moderate-intensity recommended because of extreme LDL level but no known ASCVD.  To view statin dosages by intensity, see Evidence section.  INPUTS: History of ASCVD --> 0 = No LDL Cholesterol >=190mg /dL (4.09 mmol/L) --> 1 = Yes   Diet: On Weight Watchers - lost 78, gained 25 back Cooks a lot of crock pot meals - recommendations from Weight Watchers 1-2 glasses wine daily 0 calorie or diet soda occasionally  Activity: Accounting/Tax business Water aerobics occasionally, less in cold water (2 x per month) Walks back and forth to daughter's house in same neighborhood  ROS: See HPI       Studies Reviewed: Marland Kitchen   EKG Interpretation Date/Time:  Wednesday September 22 2023 13:43:31 EST Ventricular Rate:  72 PR Interval:  140 QRS Duration:  74 QT Interval:  372 QTC Calculation: 407 R Axis:   44  Text Interpretation: Normal sinus rhythm Normal ECG When compared with ECG of 25-Jun-2015 17:19, No significant change was found Confirmed by  Eligha Bridegroom [phone]) on 09/22/2023 2:01:59 PM       Risk Assessment/Calculations:             Physical Exam:   VS: BP 130/70   Pulse 70   Ht 5' 1.5" (1.562 m)   Wt 179 lb 3.2 oz (81.3 kg)   SpO2 95%   BMI 33.31 kg/m   Wt Readings from Last 3 Encounters:  09/22/23 179 lb 3.2 oz (81.3 kg)  12/17/22 156 lb 3.2 oz (70.9 kg)   07/08/16 237 lb 12.8 oz (107.9 kg)     GEN: Well nourished, well developed in no acute distress NECK: No JVD; No carotid bruits CARDIAC: RRR, no murmurs, rubs, gallops RESPIRATORY:  Clear to auscultation without rales, wheezing or rhonchi  ABDOMEN: Soft, non-tender, non-distended EXTREMITIES:  No edema; No deformity     ASSESSMENT AND PLAN: .    CAD with precordial pain:  CT calcium score 456 (91st percentile) with bulk of calcium and RCA and aortic atherosclerosis. EKG today reveals normal sinus rhythm at 70 bpm, no ST abnormality.  She reports midsternal chest heaviness that occurs at various times and is often not very long-lasting.  She is walking some and doing water aerobics occasionally for exercise but no consistent exercise routine.  We will get coronary CT for evaluation of ischemia.  Will have her take metoprolol 100 mg 2 hours prior to the test.  Emphasized the importance of significant reduction in LDL.  We will get LP(a) for further risk stratification. Continue Crestor. Encouraged heart healthy mostly plant based diet avoiding saturated fat, processed foods, simple carbohydrates, and sugar along with aiming for at least 150 minutes of moderate intensity exercise each week.   Hyperlipidemia LDL goal < 70/Aortic atherosclerosis: Lipid panel 06/24/23 with total cholesterol 296, triglycerides 104, HDL 74, LDL-C 204. She was started on rosuvastatin 10 mg daily in early November which will likely not be potent enough to reduce LDL significantly. We will get NMR along with lipoprotein a to evaluate effectiveness. Consider PCSK9 inhibitor based on likely familial hyperlipidemia. We discussed limiting saturated fat, sugar, processed foods, simple carbohydrates and alcohol.   CV Risk Assessment: ASCVD risk score cannot be calculated due to LDL > 190.  She was placed on moderate dose rosuvastatin, which will likely not be potent enough to reduce LDL significantly.  We will reassess.  She has  additional risk factor of former tobacco abuse.  She currently vapes but is not using nicotine.  BP is well-controlled.  She does not have a history of diabetes.  Plan/Goals: Plan/Goals: 1: Aim to get 150 minutes of moderate intensity exercise each week. You should be able to answer questions but not tell a story when you are exercising. 2: Limit sugar, processed foods, simple carbohydrates and saturated fat.      Informed Consent   Shared Decision Making/Informed Consent     Disposition: 3 months with me  Signed, Eligha Bridegroom, NP-C

## 2023-09-22 ENCOUNTER — Encounter (HOSPITAL_BASED_OUTPATIENT_CLINIC_OR_DEPARTMENT_OTHER): Payer: Self-pay | Admitting: Nurse Practitioner

## 2023-09-22 ENCOUNTER — Ambulatory Visit (HOSPITAL_BASED_OUTPATIENT_CLINIC_OR_DEPARTMENT_OTHER): Payer: HMO | Admitting: Nurse Practitioner

## 2023-09-22 ENCOUNTER — Encounter (HOSPITAL_BASED_OUTPATIENT_CLINIC_OR_DEPARTMENT_OTHER): Payer: Self-pay

## 2023-09-22 VITALS — BP 130/70 | HR 70 | Ht 61.5 in | Wt 179.2 lb

## 2023-09-22 DIAGNOSIS — R931 Abnormal findings on diagnostic imaging of heart and coronary circulation: Secondary | ICD-10-CM | POA: Diagnosis not present

## 2023-09-22 DIAGNOSIS — E785 Hyperlipidemia, unspecified: Secondary | ICD-10-CM

## 2023-09-22 DIAGNOSIS — R072 Precordial pain: Secondary | ICD-10-CM

## 2023-09-22 DIAGNOSIS — I2510 Atherosclerotic heart disease of native coronary artery without angina pectoris: Secondary | ICD-10-CM | POA: Diagnosis not present

## 2023-09-22 MED ORDER — METOPROLOL TARTRATE 100 MG PO TABS: ORAL_TABLET | ORAL | 0 refills | Status: DC

## 2023-09-22 NOTE — Patient Instructions (Signed)
Medication Instructions:   Your physician recommends that you continue on your current medications as directed. Please refer to the Current Medication list given to you today.   *If you need a refill on your cardiac medications before your next appointment, please call your pharmacy*   Lab Work:  If your PCP does not want to get your fasting labs, NMR/LIPID/LPA, fasting after midnight. Please send mychart message and I will be happy to order the labs for you.  Below is a list of all the labcorps in the area.        If you have labs (blood work) drawn today and your tests are completely normal, you will receive your results only by: MyChart Message (if you have MyChart) OR A paper copy in the mail If you have any lab test that is abnormal or we need to change your treatment, we will call you to review the results.   Testing/Procedures:   Your cardiac CT will be scheduled at one of the below locations:    [facility] [address] [place], [place] [address] [phone]   Please follow these instructions carefully (unless otherwise directed):  An IV will be required for this test and Nitroglycerin will be given.   On the Night Before the Test: Be sure to Drink plenty of water. Do not consume any caffeinated/decaffeinated beverages or chocolate 12 hours prior to your test. Do not take any antihistamines 12 hours prior to your test.  On the Day of the Test: Drink plenty of water until 1 hour prior to the test. Do not eat any food 1 hour prior to test. You may take your regular medications prior to the test.  Take metoprolol (Lopressor) one tablet (1) by mouth ( 100 mg ) two hours prior to test. FEMALES- please wear underwire-free bra if available, avoid dresses & tight clothing    After the Test: Drink plenty of water. After receiving IV contrast, you may experience a mild flushed feeling. This is normal. On occasion, you may experience a mild rash up  to 24 hours after the test. This is not dangerous. If this occurs, you can take Benadryl 25 mg and increase your fluid intake. If you experience trouble breathing, this can be serious. If it is severe call 911 IMMEDIATELY. If it is mild, please call our office.  We will call to schedule your test 2-4 weeks out understanding that some insurance companies will need an authorization prior to the service being performed.   For more information and frequently asked questions, please visit our website : [email or web address]  For non-scheduling related questions, please contact the cardiac imaging nurse navigator should you have any questions/concerns: Cardiac Imaging Nurse Navigators Direct Office Dial: [phone]   For scheduling needs, including cancellations and rescheduling, please call [place], [phone].    Follow-Up: At [facility], you and your health needs are our priority.  As part of our continuing mission to provide you with exceptional heart care, we have created designated Provider Care Teams.  These Care Teams include your primary Cardiologist (physician) and Advanced Practice Providers (APPs -  Physician Assistants and Nurse Practitioners) who all work together to provide you with the care you need, when you need it.  We recommend signing up for the patient portal called "MyChart".  Sign up information is provided on this After Visit Summary.  MyChart is used to connect with patients for Virtual Visits (Telemedicine).  Patients are able to view lab/test results,  encounter notes, upcoming appointments, etc.  Non-urgent messages can be sent to your provider as well.   To learn more about what you can do with MyChart, go to [email or web address].    Your next appointment:   3 month(s)  Provider:   Eligha Bridegroom, NP   I will send you a mychart message with Lebron Conners appointment in April @ 3:35 pm. Other Instructions  Adopting a Healthy  Lifestyle.   Weight: Know what a healthy weight is for you (roughly BMI <25) and aim to maintain this. You can calculate your body mass index on your smart phone. Unfortunately, this is not the most accurate measure of healthy weight, but it is the simplest measurement to use. A more accurate measurement involves body scanning which measures lean muscle, fat tissue and bony density. We do not have this equipment at [facility].    Diet: Aim for 7+ servings of fruits and vegetables daily Limit animal fats in diet for cholesterol and heart health - choose grass fed whenever available Avoid highly processed foods (fast food burgers, tacos, fried chicken, pizza, hot dogs, french fries)  Saturated fat comes in the form of butter, lard, coconut oil, margarine, partially hydrogenated oils, and fat in meat. These increase your risk of cardiovascular disease.  Use healthy plant oils, such as olive, canola, soy, corn, sunflower and peanut.  Whole foods such as fruits, vegetables and whole grains have fiber  Men need > 38 grams of fiber per day Women need > 25 grams of fiber per day  Load up on vegetables and fruits - one-half of your plate: Aim for color and variety, and remember that potatoes dont count. Go for whole grains - one-quarter of your plate: Whole wheat, barley, wheat berries, quinoa, oats, brown rice, and foods made with them. If you want pasta, go with whole wheat pasta. Protein power - one-quarter of your plate: Fish, chicken, beans, and nuts are all healthy, versatile protein sources. Limit red meat. You need carbohydrates for energy! The type of carbohydrate is more important than the amount. Choose carbohydrates such as vegetables, fruits, whole grains, beans, and nuts in the place of white rice, white pasta, potatoes (baked or fried), macaroni and cheese, cakes, cookies, and donuts.  If youre thirsty, drink water. Coffee and tea are good in moderation, but skip sugary drinks and limit milk  and dairy products to one or two daily servings. Keep sugar intake at 6 teaspoons or 24 grams or LESS       Exercise: Aim for 150 min of moderate intensity exercise weekly for heart health, and weights twice weekly for bone health Stay active - any steps are better than no steps! Aim for 7-9 hours of sleep daily        Goals: 1: Aim to get 150 minutes of moderate intensity exercise each week. You should be able to answer questions but not tell a story when you are exercising. 2: Limit sugar, processed foods, simple carbohydrates and saturated fat.

## 2023-09-23 DIAGNOSIS — I2510 Atherosclerotic heart disease of native coronary artery without angina pectoris: Secondary | ICD-10-CM | POA: Diagnosis not present

## 2023-10-18 ENCOUNTER — Telehealth (HOSPITAL_COMMUNITY): Payer: Self-pay | Admitting: *Deleted

## 2023-10-18 NOTE — Telephone Encounter (Signed)

## 2023-10-19 ENCOUNTER — Ambulatory Visit (HOSPITAL_BASED_OUTPATIENT_CLINIC_OR_DEPARTMENT_OTHER)
Admission: RE | Admit: 2023-10-19 | Discharge: 2023-10-19 | Disposition: A | Discharge status: Home / Self Care | Payer: HMO | Source: Ambulatory Visit | Attending: Nurse Practitioner | Admitting: Nurse Practitioner

## 2023-10-19 ENCOUNTER — Encounter (HOSPITAL_BASED_OUTPATIENT_CLINIC_OR_DEPARTMENT_OTHER): Payer: Self-pay

## 2023-10-19 DIAGNOSIS — R931 Abnormal findings on diagnostic imaging of heart and coronary circulation: Secondary | ICD-10-CM | POA: Insufficient documentation

## 2023-10-19 DIAGNOSIS — I2510 Atherosclerotic heart disease of native coronary artery without angina pectoris: Secondary | ICD-10-CM | POA: Diagnosis not present

## 2023-10-19 MED ORDER — NITROGLYCERIN 0.4 MG SL SUBL
0.8000 mg | SUBLINGUAL_TABLET | Freq: Once | SUBLINGUAL | Status: AC
Start: 2023-10-19 — End: 2023-10-19
  Administered 2023-10-19: 0.8 mg via SUBLINGUAL

## 2023-10-19 MED ORDER — IOHEXOL 350 MG/ML SOLN
100.0000 mL | Freq: Once | INTRAVENOUS | Status: AC | PRN
  Administered 2023-10-19: 95 mL via INTRAVENOUS

## 2023-10-19 NOTE — Progress Notes (Signed)
 Patient reports taking her metoprolol last night instead of this morning. Patient HR is low but she denies any symptoms.

## 2023-10-21 NOTE — Addendum Note (Signed)
 Addended by: Huntley Dec REDDY on: 10/21/2023 04:55 PM   Modules accepted: Orders

## 2023-10-22 ENCOUNTER — Telehealth: Payer: Self-pay | Admitting: Nurse Practitioner

## 2023-10-22 ENCOUNTER — Telehealth: Payer: Self-pay | Admitting: *Deleted

## 2023-10-22 MED ORDER — ASPIRIN 81 MG PO TBEC: 81.0000 mg | DELAYED_RELEASE_TABLET | Freq: Every day | ORAL | Status: AC

## 2023-10-22 MED ORDER — NITROGLYCERIN 0.4 MG SL SUBL: 0.4000 mg | SUBLINGUAL_TABLET | SUBLINGUAL | 3 refills | Status: AC | PRN

## 2023-10-22 MED ORDER — METOPROLOL SUCCINATE ER 25 MG PO TB24: 25.0000 mg | ORAL_TABLET | Freq: Every day | ORAL | 3 refills | Status: DC

## 2023-10-22 NOTE — Telephone Encounter (Signed)
 S/w pt is aware appt has been made for Dr. Rosemary Holms on March 14 to discuss Heart Cath.  Marcelino Duster discussed ED precautions and was reiterated on the phone again.  Pt is aware.

## 2023-10-22 NOTE — Telephone Encounter (Signed)
 Results of coronary CT reviewed with patient. She reports increased shortness of breath with swimming and walking fast. She would like to come in to discuss cardiac cath. We will have her start aspirin 81 mg daily and Toprol XL 25 mg daily and will schedule a soon appointment with interventional cardiologist. I have also given her Rx for SL NTG and advised her on appropriate use. ER precautions advised. She verbalized understanding and agreement with plan and thanked me for the call.

## 2023-10-28 ENCOUNTER — Encounter (HOSPITAL_BASED_OUTPATIENT_CLINIC_OR_DEPARTMENT_OTHER): Payer: Self-pay

## 2023-10-29 ENCOUNTER — Ambulatory Visit: Payer: HMO | Attending: Cardiovascular Disease | Admitting: Cardiovascular Disease

## 2023-10-29 ENCOUNTER — Encounter: Payer: Self-pay | Admitting: Cardiovascular Disease

## 2023-10-29 ENCOUNTER — Other Ambulatory Visit: Payer: Self-pay | Admitting: *Deleted

## 2023-10-29 VITALS — BP 122/62 | HR 53 | Ht 61.5 in | Wt 176.0 lb

## 2023-10-29 DIAGNOSIS — I25110 Atherosclerotic heart disease of native coronary artery with unstable angina pectoris: Secondary | ICD-10-CM

## 2023-10-29 DIAGNOSIS — R931 Abnormal findings on diagnostic imaging of heart and coronary circulation: Secondary | ICD-10-CM

## 2023-10-29 DIAGNOSIS — I25118 Atherosclerotic heart disease of native coronary artery with other forms of angina pectoris: Secondary | ICD-10-CM

## 2023-10-29 DIAGNOSIS — Z01818 Encounter for other preprocedural examination: Secondary | ICD-10-CM | POA: Diagnosis not present

## 2023-10-29 DIAGNOSIS — E785 Hyperlipidemia, unspecified: Secondary | ICD-10-CM | POA: Diagnosis not present

## 2023-10-29 DIAGNOSIS — I10 Essential (primary) hypertension: Secondary | ICD-10-CM

## 2023-10-29 NOTE — Progress Notes (Signed)
 Chief Complaint  Patient presents with   Follow-up    CAD   History of Present Illness: 73 yo female with history of HTN, HLD, Graves disease, COPD, obesity, former tobacco abuse, aortic atherosclerosis, CAD (abnormal coronary calcium score) who is here today for follow up. Coronary CT calcium score of 456 in December 2024. She was seen in January 2025 as a new patient for elevated calcium score and described some chest pain. She quit smoking in 2019. Coronary CTA with non-obstructive disease in the LAD. Possible severe stenosis in the RCA and Diagonal branch.   She is here today for follow up. She has exertional chest pain and dyspnea. She denies palpitations, lower extremity edema, orthopnea, PND, dizziness, near syncope or syncope.    Primary Care Physician: Georgann Housekeeper, MD   Past Medical History:  Diagnosis Date   Adenomatous polyp    Arthritis    Carpal tunnel syndrome    COPD (chronic obstructive pulmonary disease) (HCC)    Depression    Diverticulitis    Fatty liver    GERD (gastroesophageal reflux disease)    not a problem now.   Graves disease    tx. radioactive Iodine many years ago.   Heart murmur    as a child   Hypercholesteremia    Hypothyroidism    post radioactive iodine to treat Graves' disease   Liver hemangioma    Overactive bladder    PONV (postoperative nausea and vomiting)    Postmenopausal    Shortness of breath    Sleep apnea    cpap use- not using in 2 weeks "not functioning well at this time"   Vulvar dysplasia     Past Surgical History:  Procedure Laterality Date   ABDOMINAL HYSTERECTOMY  1998   BREAST REDUCTION SURGERY  1981   CARPAL TUNNEL RELEASE Left    CATARACT EXTRACTION  2017   COLONOSCOPY W/ BIOPSIES AND POLYPECTOMY     benign   COLONOSCOPY WITH PROPOFOL N/A 10/14/2015   Procedure: COLONOSCOPY WITH PROPOFOL;  Surgeon: Charolett Bumpers, MD;  Location: [facility];  Service: Endoscopy;  Laterality: N/A;   KNEE ARTHROPLASTY  Right 05/16/2014   Procedure: COMPUTER ASSISTED RIGHT TOTAL KNEE ARTHROPLASTY;  Surgeon: Eldred Manges, MD;  Location: [facility];  Service: Orthopedics;  Laterality: Right;   KNEE ARTHROSCOPY Right 2014   right shoulder surgery  2009   TONSILLECTOMY  1985   TOTAL KNEE ARTHROPLASTY Right 05/16/2014   DR YATES   TUBAL LIGATION     VESICOVAGINAL FISTULA CLOSURE W/ TAH  1998    Current Outpatient Medications  Medication Sig Dispense Refill   albuterol (VENTOLIN HFA) 108 (90 Base) MCG/ACT inhaler Inhale 2 puffs into the lungs every 4 (four) hours as needed for wheezing or shortness of breath (Cough). 18 g 1   aspirin EC 81 MG tablet Take 1 tablet (81 mg total) by mouth daily. Swallow whole.     buPROPion (WELLBUTRIN XL) 300 MG 24 hr tablet Take 300 mg by mouth daily.     calcium carbonate (TUMS - DOSED IN MG ELEMENTAL CALCIUM) 500 MG chewable tablet Chew 1 tablet by mouth as needed for heartburn.     cholecalciferol (VITAMIN D3) 25 MCG (1000 UNIT) tablet Take 1,000 Units by mouth daily.     escitalopram (LEXAPRO) 10 MG tablet Take 10 mg by mouth daily.     levothyroxine (SYNTHROID, LEVOTHROID) 88 MCG tablet 88 mcg daily.  2   metoprolol succinate (TOPROL  XL) 25 MG 24 hr tablet Take 1 tablet (25 mg total) by mouth daily. 90 tablet 3   nitroGLYCERIN (NITROSTAT) 0.4 MG SL tablet Place 1 tablet (0.4 mg total) under the tongue every 5 (five) minutes as needed for chest pain. 25 tablet 3   rosuvastatin (CRESTOR) 10 MG tablet Take 10 mg by mouth at bedtime.     No current facility-administered medications for this visit.    Allergies  Allergen Reactions   Hydrocodone Nausea And Vomiting   Breo Ellipta [Fluticasone Furoate-Vilanterol]     Light headed; dizzy spells, nasuea   Codeine     vomiting   Oxycodone Hcl     vomiting   Strawberry Extract Hives    Social History   Socioeconomic History   Marital status: Divorced    Spouse name: Not on file   Number of children: 1   Years of education:  Not on file   Highest education level: Not on file  Occupational History   Occupation: Accountant  Tobacco Use   Smoking status: Former    Current packs/day: 0.00    Average packs/day: 0.5 packs/day for 33.0 years (16.5 ttl pk-yrs)    Types: Cigarettes    Start date: 08/24/1968    Quit date: 08/24/2001    Years since quitting: 22.1   Smokeless tobacco: Never  Substance and Sexual Activity   Alcohol use: Yes    Comment: wine a couple times a week   Drug use: No   Sexual activity: Not on file  Other Topics Concern   Not on file  Social History Narrative   Lives alone at home   Caffeine- coffee, 1 cup daily, occas soda   Social Drivers of Corporate investment banker Strain: Not on file  Food Insecurity: Not on file  Transportation Needs: Not on file  Physical Activity: Not on file  Stress: Not on file  Social Connections: Not on file  Intimate Partner Violence: Not on file    Family History  Problem Relation Age of Onset   Heart attack Father    Alzheimer's disease Father    Lung cancer Mother    Asthma Daughter    Allergies Daughter     Review of Systems:  As stated in the HPI and otherwise negative.   BP 122/62   Pulse (!) 53   Ht 5' 1.5" (1.562 m)   Wt 79.8 kg   SpO2 96%   BMI 32.72 kg/m   Physical Examination: General: Well developed, well nourished, NAD  HEENT: OP clear, mucus membranes moist  SKIN: warm, dry. No rashes. Neuro: No focal deficits  Musculoskeletal: Muscle strength 5/5 all ext  Psychiatric: Mood and affect normal  Neck: No JVD, no carotid bruits, no thyromegaly, no lymphadenopathy.  Lungs:Clear bilaterally, no wheezes, rhonci, crackles Cardiovascular: Regular rate and rhythm. No murmurs, gallops or rubs. Abdomen:Soft. Bowel sounds present. Non-tender.  Extremities: No lower extremity edema. Pulses are 2 + in the bilateral DP/PT.  EKG:  EKG is ordered today. The ekg ordered today demonstrates  EKG Interpretation Date/Time:  Friday October 29 2023 11:49:06 EST Ventricular Rate:  53 PR Interval:  148 QRS Duration:  74 QT Interval:  412 QTC Calculation: 386 R Axis:   72  Text Interpretation: Sinus bradycardia When compared with ECG of 22-Sep-2023 13:43, No significant change was found Confirmed by Verne Carrow [phone]) on 10/29/2023 11:51:12 AM   Recent Labs: No results found for requested labs within last 365 days.  Lipid Panel No results found for: "CHOL", "TRIG", "HDL", "CHOLHDL", "VLDL", "LDLCALC", "LDLDIRECT"   Wt Readings from Last 3 Encounters:  10/29/23 79.8 kg  09/22/23 81.3 kg  12/17/22 70.9 kg    Assessment and Plan:   1. CAD with unstable angina: Abnormal coronary CTA with possible high grade stenosis in the RCA and Diagonal. Cardiac cath is indicated. Will plan cardiac cath at [facility] 11/10/23 at 10 am.   I have reviewed the risks, indications, and alternatives to cardiac catheterization, possible angioplasty, and stenting with the patient. Risks include but are not limited to bleeding, infection, vascular injury, stroke, myocardial infection, arrhythmia, kidney injury, radiation-related injury in the case of prolonged fluoroscopy use, emergency cardiac surgery, and death. The patient understands the risks of serious complication is 1-2 in 1000 with diagnostic cardiac cath and 1-2% or less with angioplasty/stenting. -Continue ASA, Toprol and Crestor BMET and CBC today  2. HTN: BP is well controlled. No changes  3. Hyperlipidemia: Lipids controlled in primary care. Continue Crestor  Labs/ tests ordered today include:   Orders Placed This Encounter  Procedures   Basic metabolic panel   CBC   EKG 12-Lead   Disposition:   F/U with me 4-6 weeks   Signed, Verne Carrow, MD, [facility] 10/29/2023 12:01 PM    [facility] [address] [place], [place], [place]  [address] Phone: [phone]; Fax: [phone]

## 2023-10-29 NOTE — H&P (View-Only) (Signed)
 Chief Complaint  Patient presents with   Follow-up    CAD   History of Present Illness: 73 yo female with history of HTN, HLD, Graves disease, COPD, obesity, former tobacco abuse, aortic atherosclerosis, CAD (abnormal coronary calcium score) who is here today for follow up. Coronary CT calcium score of 456 in December 2024. She was seen in January 2025 as a new patient for elevated calcium score and described some chest pain. She quit smoking in 2019. Coronary CTA with non-obstructive disease in the LAD. Possible severe stenosis in the RCA and Diagonal branch.   She is here today for follow up. She has exertional chest pain and dyspnea. She denies palpitations, lower extremity edema, orthopnea, PND, dizziness, near syncope or syncope.    Primary Care Physician: Georgann Housekeeper, MD   Past Medical History:  Diagnosis Date   Adenomatous polyp    Arthritis    Carpal tunnel syndrome    COPD (chronic obstructive pulmonary disease) (HCC)    Depression    Diverticulitis    Fatty liver    GERD (gastroesophageal reflux disease)    not a problem now.   Graves disease    tx. radioactive Iodine many years ago.   Heart murmur    as a child   Hypercholesteremia    Hypothyroidism    post radioactive iodine to treat Graves' disease   Liver hemangioma    Overactive bladder    PONV (postoperative nausea and vomiting)    Postmenopausal    Shortness of breath    Sleep apnea    cpap use- not using in 2 weeks "not functioning well at this time"   Vulvar dysplasia     Past Surgical History:  Procedure Laterality Date   ABDOMINAL HYSTERECTOMY  1998   BREAST REDUCTION SURGERY  1981   CARPAL TUNNEL RELEASE Left    CATARACT EXTRACTION  2017   COLONOSCOPY W/ BIOPSIES AND POLYPECTOMY     benign   COLONOSCOPY WITH PROPOFOL N/A 10/14/2015   Procedure: COLONOSCOPY WITH PROPOFOL;  Surgeon: Charolett Bumpers, MD;  Location: [facility];  Service: Endoscopy;  Laterality: N/A;   KNEE ARTHROPLASTY  Right 05/16/2014   Procedure: COMPUTER ASSISTED RIGHT TOTAL KNEE ARTHROPLASTY;  Surgeon: Eldred Manges, MD;  Location: [facility];  Service: Orthopedics;  Laterality: Right;   KNEE ARTHROSCOPY Right 2014   right shoulder surgery  2009   TONSILLECTOMY  1985   TOTAL KNEE ARTHROPLASTY Right 05/16/2014   DR YATES   TUBAL LIGATION     VESICOVAGINAL FISTULA CLOSURE W/ TAH  1998    Current Outpatient Medications  Medication Sig Dispense Refill   albuterol (VENTOLIN HFA) 108 (90 Base) MCG/ACT inhaler Inhale 2 puffs into the lungs every 4 (four) hours as needed for wheezing or shortness of breath (Cough). 18 g 1   aspirin EC 81 MG tablet Take 1 tablet (81 mg total) by mouth daily. Swallow whole.     buPROPion (WELLBUTRIN XL) 300 MG 24 hr tablet Take 300 mg by mouth daily.     calcium carbonate (TUMS - DOSED IN MG ELEMENTAL CALCIUM) 500 MG chewable tablet Chew 1 tablet by mouth as needed for heartburn.     cholecalciferol (VITAMIN D3) 25 MCG (1000 UNIT) tablet Take 1,000 Units by mouth daily.     escitalopram (LEXAPRO) 10 MG tablet Take 10 mg by mouth daily.     levothyroxine (SYNTHROID, LEVOTHROID) 88 MCG tablet 88 mcg daily.  2   metoprolol succinate (TOPROL  XL) 25 MG 24 hr tablet Take 1 tablet (25 mg total) by mouth daily. 90 tablet 3   nitroGLYCERIN (NITROSTAT) 0.4 MG SL tablet Place 1 tablet (0.4 mg total) under the tongue every 5 (five) minutes as needed for chest pain. 25 tablet 3   rosuvastatin (CRESTOR) 10 MG tablet Take 10 mg by mouth at bedtime.     No current facility-administered medications for this visit.    Allergies  Allergen Reactions   Hydrocodone Nausea And Vomiting   Breo Ellipta [Fluticasone Furoate-Vilanterol]     Light headed; dizzy spells, nasuea   Codeine     vomiting   Oxycodone Hcl     vomiting   Strawberry Extract Hives    Social History   Socioeconomic History   Marital status: Divorced    Spouse name: Not on file   Number of children: 1   Years of education:  Not on file   Highest education level: Not on file  Occupational History   Occupation: Accountant  Tobacco Use   Smoking status: Former    Current packs/day: 0.00    Average packs/day: 0.5 packs/day for 33.0 years (16.5 ttl pk-yrs)    Types: Cigarettes    Start date: 08/24/1968    Quit date: 08/24/2001    Years since quitting: 22.1   Smokeless tobacco: Never  Substance and Sexual Activity   Alcohol use: Yes    Comment: wine a couple times a week   Drug use: No   Sexual activity: Not on file  Other Topics Concern   Not on file  Social History Narrative   Lives alone at home   Caffeine- coffee, 1 cup daily, occas soda   Social Drivers of Corporate investment banker Strain: Not on file  Food Insecurity: Not on file  Transportation Needs: Not on file  Physical Activity: Not on file  Stress: Not on file  Social Connections: Not on file  Intimate Partner Violence: Not on file    Family History  Problem Relation Age of Onset   Heart attack Father    Alzheimer's disease Father    Lung cancer Mother    Asthma Daughter    Allergies Daughter     Review of Systems:  As stated in the HPI and otherwise negative.   BP 122/62   Pulse (!) 53   Ht 5' 1.5" (1.562 m)   Wt 79.8 kg   SpO2 96%   BMI 32.72 kg/m   Physical Examination: General: Well developed, well nourished, NAD  HEENT: OP clear, mucus membranes moist  SKIN: warm, dry. No rashes. Neuro: No focal deficits  Musculoskeletal: Muscle strength 5/5 all ext  Psychiatric: Mood and affect normal  Neck: No JVD, no carotid bruits, no thyromegaly, no lymphadenopathy.  Lungs:Clear bilaterally, no wheezes, rhonci, crackles Cardiovascular: Regular rate and rhythm. No murmurs, gallops or rubs. Abdomen:Soft. Bowel sounds present. Non-tender.  Extremities: No lower extremity edema. Pulses are 2 + in the bilateral DP/PT.  EKG:  EKG is ordered today. The ekg ordered today demonstrates  EKG Interpretation Date/Time:  Friday October 29 2023 11:49:06 EST Ventricular Rate:  53 PR Interval:  148 QRS Duration:  74 QT Interval:  412 QTC Calculation: 386 R Axis:   72  Text Interpretation: Sinus bradycardia When compared with ECG of 22-Sep-2023 13:43, No significant change was found Confirmed by Verne Carrow [phone]) on 10/29/2023 11:51:12 AM   Recent Labs: No results found for requested labs within last 365 days.  Lipid Panel No results found for: "CHOL", "TRIG", "HDL", "CHOLHDL", "VLDL", "LDLCALC", "LDLDIRECT"   Wt Readings from Last 3 Encounters:  10/29/23 79.8 kg  09/22/23 81.3 kg  12/17/22 70.9 kg    Assessment and Plan:   1. CAD with unstable angina: Abnormal coronary CTA with possible high grade stenosis in the RCA and Diagonal. Cardiac cath is indicated. Will plan cardiac cath at [facility] 11/10/23 at 10 am.   I have reviewed the risks, indications, and alternatives to cardiac catheterization, possible angioplasty, and stenting with the patient. Risks include but are not limited to bleeding, infection, vascular injury, stroke, myocardial infection, arrhythmia, kidney injury, radiation-related injury in the case of prolonged fluoroscopy use, emergency cardiac surgery, and death. The patient understands the risks of serious complication is 1-2 in 1000 with diagnostic cardiac cath and 1-2% or less with angioplasty/stenting. -Continue ASA, Toprol and Crestor BMET and CBC today  2. HTN: BP is well controlled. No changes  3. Hyperlipidemia: Lipids controlled in primary care. Continue Crestor  Labs/ tests ordered today include:   Orders Placed This Encounter  Procedures   Basic metabolic panel   CBC   EKG 12-Lead   Disposition:   F/U with me 4-6 weeks   Signed, Verne Carrow, MD, [facility] 10/29/2023 12:01 PM    [facility] [address] [place], [place], [place]  [address] Phone: [phone]; Fax: [phone]

## 2023-10-29 NOTE — Patient Instructions (Addendum)
 Medication Instructions:  No changes *If you need a refill on your cardiac medications before your next appointment, please call your pharmacy*   Lab Work: Go to [facility] for blood work (bmet, cbc)   Testing/Procedures: Your physician has requested that you have a cardiac catheterization. Cardiac catheterization is used to diagnose and/or treat various heart conditions. Doctors may recommend this procedure for a number of different reasons. The most common reason is to evaluate chest pain. Chest pain can be a symptom of coronary artery disease (CAD), and cardiac catheterization can show whether plaque is narrowing or blocking your heart's arteries. This procedure is also used to evaluate the valves, as well as measure the blood flow and oxygen levels in different parts of your heart. For further information please visit [email or web address] Please follow instruction sheet, as given.   Follow-Up: As planned   Other Instructions       Cardiac/Peripheral Catheterization   You are scheduled for a Cardiac Catheterization on Wednesday, March 28 with Dr. Verne Carrow.  1. Please arrive at the [facility] (Main Entrance A) at [facility]: [address] [place], [place] [address] at 8:00 AM (This time is TWO hour(s) before your procedure to ensure your preparation).   Free valet parking service is available. You will check in at ADMITTING. The support person will be asked to wait in the waiting room.  It is OK to have someone drop you off and come back when you are ready to be discharged.        Special note: Every effort is made to have your procedure done on time. Please understand that emergencies sometimes delay scheduled procedures.  2. Diet: Do not eat solid foods after midnight.  You may have clear liquids until 5 AM the day of the procedure.  3. Labs: You will need to have blood drawn today or Monday at [facility],  You do not need to be fasting.  4. Medication  instructions in preparation for your procedure:   Contrast Allergy: No  On the morning of your procedure, take Aspirin 81 mg and any morning medicines NOT listed above.  You may use sips of water.  5. Plan to go home the same day, you will only stay overnight if medically necessary. 6. You MUST have a responsible adult to drive you home. 7. An adult MUST be with you the first 24 hours after you arrive home. 8. Bring a current list of your medications, and the last time and date medication taken. 9. Bring ID and current insurance cards. 10.Please wear clothes that are easy to get on and off and wear slip-on shoes.  Thank you for allowing [place] to care for you!   -- [facility]

## 2023-11-01 DIAGNOSIS — I10 Essential (primary) hypertension: Secondary | ICD-10-CM | POA: Diagnosis not present

## 2023-11-01 DIAGNOSIS — R931 Abnormal findings on diagnostic imaging of heart and coronary circulation: Secondary | ICD-10-CM | POA: Diagnosis not present

## 2023-11-02 LAB — BASIC METABOLIC PANEL
BUN/Creatinine Ratio: 16 (ref 12–28)
BUN: 12 mg/dL (ref 8–27)
CO2: 21 mmol/L (ref 20–29)
Calcium: 10.2 mg/dL (ref 8.7–10.3)
Chloride: 105 mmol/L (ref 96–106)
Creatinine, Ser: 0.75 mg/dL (ref 0.57–1.00)
Glucose: 85 mg/dL (ref 70–99)
Potassium: 4.8 mmol/L (ref 3.5–5.2)
Sodium: 141 mmol/L (ref 134–144)
eGFR: 86 mL/min/{1.73_m2} (ref >59)

## 2023-11-02 LAB — CBC
Hematocrit: 44 % (ref 34.0–46.6)
Hemoglobin: 14.5 g/dL (ref 11.1–15.9)
MCH: 30.9 pg (ref 26.6–33.0)
MCHC: 33 g/dL (ref 31.5–35.7)
MCV: 94 fL (ref 79–97)
Platelets: 223 10*3/uL (ref 150–450)
RBC: 4.69 x10E6/uL (ref 3.77–5.28)
RDW: 12.8 % (ref 11.7–15.4)
WBC: 5.9 10*3/uL (ref 3.4–10.8)

## 2023-11-05 ENCOUNTER — Ambulatory Visit: Payer: HMO | Admitting: Cardiology

## 2023-11-15 DIAGNOSIS — R0981 Nasal congestion: Secondary | ICD-10-CM | POA: Diagnosis not present

## 2023-11-15 DIAGNOSIS — J029 Acute pharyngitis, unspecified: Secondary | ICD-10-CM | POA: Diagnosis not present

## 2023-11-15 DIAGNOSIS — Z03818 Encounter for observation for suspected exposure to other biological agents ruled out: Secondary | ICD-10-CM | POA: Diagnosis not present

## 2023-11-15 DIAGNOSIS — R5383 Other fatigue: Secondary | ICD-10-CM | POA: Diagnosis not present

## 2023-11-17 ENCOUNTER — Telehealth: Payer: Self-pay | Admitting: *Deleted

## 2023-11-17 NOTE — Telephone Encounter (Signed)
 Cardiac Catheterization scheduled at [facility] for: Friday November 19, 2023 10:30 AM Arrival time [facility] Main Entrance A at: 8:30 AM  Nothing to eat after midnight prior to procedure, clear liquids until 5 AM day of procedure.  Medication instructions: -Usual morning medications can be taken with sips of water including aspirin 81 mg.  Plan to go home the same day, you will only stay overnight if medically necessary.  You must have responsible adult to drive you home.  Someone must be with you the first 24 hours after you arrive home.  Reviewed procedure instructions with patient.

## 2023-11-19 ENCOUNTER — Encounter (HOSPITAL_COMMUNITY): Payer: Self-pay | Admitting: Cardiovascular Disease

## 2023-11-19 ENCOUNTER — Ambulatory Visit (HOSPITAL_COMMUNITY)
Admission: RE | Admit: 2023-11-19 | Discharge: 2023-11-19 | Disposition: A | Discharge status: Home / Self Care | Source: Ambulatory Visit | Attending: Cardiovascular Disease | Admitting: Cardiovascular Disease

## 2023-11-19 ENCOUNTER — Other Ambulatory Visit: Payer: Self-pay

## 2023-11-19 ENCOUNTER — Encounter (HOSPITAL_COMMUNITY)
Admission: RE | Disposition: A | Discharge status: Home / Self Care | Payer: Self-pay | Source: Ambulatory Visit | Attending: Cardiovascular Disease

## 2023-11-19 DIAGNOSIS — Z8249 Family history of ischemic heart disease and other diseases of the circulatory system: Secondary | ICD-10-CM | POA: Diagnosis not present

## 2023-11-19 DIAGNOSIS — I25708 Atherosclerosis of coronary artery bypass graft(s), unspecified, with other forms of angina pectoris: Secondary | ICD-10-CM | POA: Diagnosis present

## 2023-11-19 DIAGNOSIS — Z87891 Personal history of nicotine dependence: Secondary | ICD-10-CM | POA: Insufficient documentation

## 2023-11-19 DIAGNOSIS — Z7982 Long term (current) use of aspirin: Secondary | ICD-10-CM | POA: Insufficient documentation

## 2023-11-19 DIAGNOSIS — E7800 Pure hypercholesterolemia, unspecified: Secondary | ICD-10-CM | POA: Insufficient documentation

## 2023-11-19 DIAGNOSIS — I10 Essential (primary) hypertension: Secondary | ICD-10-CM | POA: Diagnosis not present

## 2023-11-19 DIAGNOSIS — I2510 Atherosclerotic heart disease of native coronary artery without angina pectoris: Secondary | ICD-10-CM

## 2023-11-19 DIAGNOSIS — I25118 Atherosclerotic heart disease of native coronary artery with other forms of angina pectoris: Secondary | ICD-10-CM | POA: Diagnosis not present

## 2023-11-19 DIAGNOSIS — Z79899 Other long term (current) drug therapy: Secondary | ICD-10-CM | POA: Diagnosis not present

## 2023-11-19 HISTORY — PX: LEFT HEART CATH AND CORONARY ANGIOGRAPHY: CATH118249

## 2023-11-19 SURGERY — LEFT HEART CATH AND CORONARY ANGIOGRAPHY
Anesthesia: LOCAL

## 2023-11-19 MED ORDER — HEPARIN SODIUM (PORCINE) 1000 UNIT/ML IJ SOLN
INTRAMUSCULAR | Status: AC
  Filled 2023-11-19: qty 10

## 2023-11-19 MED ORDER — FENTANYL CITRATE (PF) 100 MCG/2ML IJ SOLN
INTRAMUSCULAR | Status: AC
  Filled 2023-11-19: qty 2

## 2023-11-19 MED ORDER — LABETALOL HCL 5 MG/ML IV SOLN: 10.0000 mg | INTRAVENOUS | Status: DC | PRN

## 2023-11-19 MED ORDER — FENTANYL CITRATE (PF) 100 MCG/2ML IJ SOLN
INTRAMUSCULAR | Status: DC | PRN
  Administered 2023-11-19 (×2): 25 ug via INTRAVENOUS

## 2023-11-19 MED ORDER — SODIUM CHLORIDE 0.9% FLUSH: 3.0000 mL | INTRAVENOUS | Status: DC | PRN

## 2023-11-19 MED ORDER — HEPARIN SODIUM (PORCINE) 1000 UNIT/ML IJ SOLN
INTRAMUSCULAR | Status: DC | PRN
  Administered 2023-11-19: 4000 [IU] via INTRAVENOUS

## 2023-11-19 MED ORDER — ASPIRIN 81 MG PO CHEW: 81.0000 mg | CHEWABLE_TABLET | ORAL | Status: DC

## 2023-11-19 MED ORDER — HYDRALAZINE HCL 20 MG/ML IJ SOLN: 10.0000 mg | INTRAMUSCULAR | Status: DC | PRN

## 2023-11-19 MED ORDER — VERAPAMIL HCL 2.5 MG/ML IV SOLN
INTRAVENOUS | Status: AC
  Filled 2023-11-19: qty 2

## 2023-11-19 MED ORDER — VERAPAMIL HCL 2.5 MG/ML IV SOLN
INTRAVENOUS | Status: DC | PRN
  Administered 2023-11-19: 10 mL via INTRA_ARTERIAL

## 2023-11-19 MED ORDER — ACETAMINOPHEN 325 MG PO TABS: 650.0000 mg | ORAL_TABLET | ORAL | Status: DC | PRN

## 2023-11-19 MED ORDER — LIDOCAINE HCL (PF) 1 % IJ SOLN
INTRAMUSCULAR | Status: AC
  Filled 2023-11-19: qty 30

## 2023-11-19 MED ORDER — SODIUM CHLORIDE 0.9 % IV SOLN: 250.0000 mL | INTRAVENOUS | Status: DC | PRN

## 2023-11-19 MED ORDER — SODIUM CHLORIDE 0.9 % WEIGHT BASED INFUSION: 1.0000 mL/kg/h | INTRAVENOUS | Status: DC

## 2023-11-19 MED ORDER — IOHEXOL 350 MG/ML SOLN
INTRAVENOUS | Status: DC | PRN
  Administered 2023-11-19: 35 mL via INTRA_ARTERIAL

## 2023-11-19 MED ORDER — MIDAZOLAM HCL 2 MG/2ML IJ SOLN
INTRAMUSCULAR | Status: AC
  Filled 2023-11-19: qty 2

## 2023-11-19 MED ORDER — LIDOCAINE HCL (PF) 1 % IJ SOLN
INTRAMUSCULAR | Status: DC | PRN
  Administered 2023-11-19: 2 mL

## 2023-11-19 MED ORDER — HEPARIN (PORCINE) IN NACL 1000-0.9 UT/500ML-% IV SOLN
INTRAVENOUS | Status: DC | PRN
  Administered 2023-11-19 (×2): 500 mL

## 2023-11-19 MED ORDER — SODIUM CHLORIDE 0.9% FLUSH: 3.0000 mL | Freq: Two times a day (BID) | INTRAVENOUS | Status: DC

## 2023-11-19 MED ORDER — MIDAZOLAM HCL 2 MG/2ML IJ SOLN
INTRAMUSCULAR | Status: DC | PRN
  Administered 2023-11-19 (×2): 1 mg via INTRAVENOUS

## 2023-11-19 MED ORDER — SODIUM CHLORIDE 0.9 % WEIGHT BASED INFUSION: 3.0000 mL/kg/h | INTRAVENOUS | Status: AC

## 2023-11-19 MED ORDER — SODIUM CHLORIDE 0.9 % IV SOLN: INTRAVENOUS | Status: AC

## 2023-11-19 MED ORDER — ONDANSETRON HCL 4 MG/2ML IJ SOLN: 4.0000 mg | Freq: Four times a day (QID) | INTRAMUSCULAR | Status: DC | PRN

## 2023-11-19 SURGICAL SUPPLY — 8 items
CATH 5FR JL3.5 JR4 ANG PIG MP (CATHETERS) IMPLANT
DEVICE RAD COMP TR BAND LRG (VASCULAR PRODUCTS) IMPLANT
GLIDESHEATH SLEND SS 6F .021 (SHEATH) IMPLANT
GUIDEWIRE INQWIRE 1.5J.035X260 (WIRE) IMPLANT
INQWIRE 1.5J .035X260CM (WIRE) ×1 IMPLANT
PACK CARDIAC CATHETERIZATION (CUSTOM PROCEDURE TRAY) ×1 IMPLANT
SET ATX-X65L (MISCELLANEOUS) IMPLANT
WIRE HI TORQ VERSACORE-J 145CM (WIRE) IMPLANT

## 2023-11-19 NOTE — Interval H&P Note (Signed)
 History and Physical Interval Note:  11/19/2023 10:26 AM  Jennifer Barker  has presented today for surgery, with the diagnosis of unstable angina.  The various methods of treatment have been discussed with the patient and family. After consideration of risks, benefits and other options for treatment, the patient has consented to  Procedure(s): LEFT HEART CATH AND CORONARY ANGIOGRAPHY (N/A) as a surgical intervention.  The patient's history has been reviewed, patient examined, no change in status, stable for surgery.  I have reviewed the patient's chart and labs.  Questions were answered to the patient's satisfaction.    Cath Lab Visit (complete for each Cath Lab visit)  Clinical Evaluation Leading to the Procedure:   ACS: No.  Non-ACS:    Anginal Classification: CCS III  Anti-ischemic medical therapy: Minimal Therapy (1 class of medications)  Non-Invasive Test Results: High-risk stress test findings: cardiac mortality >3%/year (Coronary CTA with possible severe stenosis in the RCA and Diagona)  Prior CABG: No previous CABG    Jennifer Barker

## 2023-11-26 ENCOUNTER — Encounter: Payer: Self-pay | Admitting: Cardiovascular Disease

## 2023-11-26 DIAGNOSIS — H04123 Dry eye syndrome of bilateral lacrimal glands: Secondary | ICD-10-CM | POA: Diagnosis not present

## 2023-11-26 DIAGNOSIS — H26491 Other secondary cataract, right eye: Secondary | ICD-10-CM | POA: Diagnosis not present

## 2023-12-13 DIAGNOSIS — H26491 Other secondary cataract, right eye: Secondary | ICD-10-CM | POA: Diagnosis not present

## 2023-12-21 NOTE — Progress Notes (Signed)
 Cardiology Office Note:  .   Date:  12/23/2023 ID:  Jennifer Barker, DOB [birth year], MRN [id number] PCP: Jearldine Mina, MD [facility] Providers Cardiologist:  None   Patient Profile: .      PMH Aortic atherosclerosis Coronary artery disease CT Calcium score 08/03/23 CAC score 456 (91st percentile) LM 0, LAD 95, LCx 0, RCA 361 Coronary CTA 10/19/23 Possible high grade stenosis in RCA and diagonal per CTFFR >> cardiac cath recommended [facility] 11/19/23 mRCA 50% Ost RCA to prox RCA 30% Prox LAD to mLAD 20% 1st diag 30% Hypertension Mixed hyperlipidemia COPD Morbid obesity Former tobacco abuse Quit smoking 2019 Vapes non-nicotine substance Fatty liver Hypothyroidism Family history early CAD in her father Mildly elevated lipoprotein a  Referred to cardiology and seen by me on 09/22/23 for elevated CT calcium score. She reported familial history of high cholesterol and heart disease and a personal history of hyperlipidemia.  LDL was 204 as of October 31st, significantly above the desired level of under 100. She was not aware of this significant elevation in the past which is likely familial hyperlipidemia. Currently on Crestor. She quit smoking in 2019 and now vapes without nicotine. BP which was historically low, has increased with age but not on anti-hypertensive therapy. Despite working full-time, she tries to maintain an active lifestyle with water aerobics and walking, although she admits water aerobics is only a few times per month in the winter months. She has been on Weight Watchers and successfully lost 78 pounds, but has recently gained back 25. Admits to healthy eating, but "gets off track." Says she can do better when she is focused. Admits to daily alcohol  consumption, typically one or two glasses of wine.  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  She lives in the same neighborhood as her daughter who has a 33-year-old granddaughter that she enjoys spending time  with.  Her father had 5 vessel CABG and lots of heart issues, possibly starting in his 8s.  She reports occasional chest heaviness which resolves after a few seconds or minutes. No palpitations, shortness of breath, orthopnea, PND, edema, presyncope, or syncope.       History of Present Illness: .    History of Present Illness Jennifer Barker is a very pleasant 73 year old female with coronary artery disease who presents for follow-up after cardiac catheterization. She requests to review cath details. Cardiac catheterization revealed nonobstructive CAD. No stents were placed. She reports she is feeling well and is engaging and regular exercise through swimming.  She denies chest pain, palpitations, orthopnea, PND, edema, presyncope, syncope.  She reports chronic shortness of breath that she attributes to COPD, but feels it is stable. No concerning side effects on medications. She previously has lost 78 pounds but regained 30 pounds. She struggles with hunger and maintaining weight loss, was previously on Weight Watchers but the evening group no longer meets. She reports good hydration and interest in  joining a weightlifting class.   ROS: See HPI       Studies Reviewed: Aaron Aas         LHC 11/19/23   Mid RCA lesion is 50% stenosed.   Ost RCA to Prox RCA lesion is 30% stenosed.   Prox LAD to Mid LAD lesion is 20% stenosed.   1st Diag lesion is 30% stenosed.   Mild plaque in the LAD Mild to moderate plaque in the moderate caliber Diagonal branch No evidence of plaque in the Circumflex The RCA  is a large dominant vessel with mild proximal stenosis and moderate (50-60%) mid vessel stenosis. This does not appear to be flow limiting  Risk Assessment/Calculations:             Physical Exam:   VS: BP 110/68 (BP Location: Left Arm, Patient Position: Sitting)   Pulse (!) 48   Ht 5\' 1"  (1.549 m)   Wt 180 lb 11.2 oz (82 kg)   SpO2 96%   BMI 34.14 kg/m   Wt Readings from Last 3 Encounters:   12/22/23 180 lb 11.2 oz (82 kg)  11/19/23 177 lb (80.3 kg)  10/29/23 176 lb (79.8 kg)     GEN: Well nourished, well developed in no acute distress NECK: No JVD; Lt carotid bruit CARDIAC: RRR, no murmurs, rubs, gallops RESPIRATORY:  Clear to auscultation without rales, wheezing or rhonchi  ABDOMEN: Soft, non-tender, non-distended EXTREMITIES:  No edema; No deformity     ASSESSMENT AND PLAN: .    CAD with precordial pain:  CT calcium score 456 (91st percentile) with bulk of calcium and RCA and aortic atherosclerosis. Due to concerning symptoms of chest heaviness, risk factors of significantly elevated LDL along with family history of early CAD, coronary CTA was ordered for ischemia evaluation. CCTA revealed concern for high grade stenosis in RCA and diagonal. She was seen by Dr. Abel Hoe and underwent LHC on 11/19/23 which revealed nonobstructive CAD. She denies chest pain, dyspnea, or other symptoms concerning for angina. No indication for further ischemic evaluation at this time. Encouraged continue to focus on heart healthy mostly plant based diet avoiding saturated fat, processed foods, simple carbohydrates, and sugar along with aiming for at least 150 minutes of moderate intensity exercise each week. No bleeding concerns. Continue aspirin , metoprolol , rosuvastatin.   Hyperlipidemia LDL goal < 70/Aortic atherosclerosis/Elevated LP(a): Mildly elevated LP(a) at 80.6. Lipid panel 09/23/2023 revealed total cholesterol 216, LDL-C 105, HDL-C 99, and triglycerides 70. She started rosuvastatin 20 mg daily. This is an improvement from LDL of 204 on 06/24/23. We will recheck lipid panel when she can return fasting. Consider PCSK9 inhibitor if LDL remains above goal. Continue rosuvastatin.   Carotid bruit: She has left carotid bruit.  Reports her father had a history of carotid artery disease.  We will get carotid duplex for evaluation of carotid artery disease.  No symptoms of TIA or  stroke.  Hypertension: BP is well controlled, somewhat soft. She reports lightheadedness worsened by sudden position changes. Encouraged her to stand up slowly to prevent instability and to ensure adequate hydration. Renal function stable on labs completed 11/01/2023. Continue metoprolol .  Shortness of Breath: She reports chronic shortness of breath that she attributes to COPD.  She feels it is stable.  No orthopnea, PND, edema. Weight is stable. No report of abnormal LV function or elevated LVEDP on cath 11/19/2023. Encouraged to continue exercise and avoidance of high sodium diet.       Disposition: 1 year with Dr. Abel Hoe or APP  Signed, Slater Duncan, NP-C

## 2023-12-22 ENCOUNTER — Ambulatory Visit (HOSPITAL_BASED_OUTPATIENT_CLINIC_OR_DEPARTMENT_OTHER): Payer: HMO | Admitting: Nurse Practitioner

## 2023-12-22 ENCOUNTER — Encounter (HOSPITAL_BASED_OUTPATIENT_CLINIC_OR_DEPARTMENT_OTHER): Payer: Self-pay | Admitting: Nurse Practitioner

## 2023-12-22 VITALS — BP 110/68 | HR 48 | Ht 61.0 in | Wt 180.7 lb

## 2023-12-22 DIAGNOSIS — R0989 Other specified symptoms and signs involving the circulatory and respiratory systems: Secondary | ICD-10-CM

## 2023-12-22 DIAGNOSIS — E7841 Elevated Lipoprotein(a): Secondary | ICD-10-CM | POA: Diagnosis not present

## 2023-12-22 DIAGNOSIS — R931 Abnormal findings on diagnostic imaging of heart and coronary circulation: Secondary | ICD-10-CM

## 2023-12-22 DIAGNOSIS — E785 Hyperlipidemia, unspecified: Secondary | ICD-10-CM

## 2023-12-22 DIAGNOSIS — I700 Atherosclerosis of aorta: Secondary | ICD-10-CM

## 2023-12-22 DIAGNOSIS — I10 Essential (primary) hypertension: Secondary | ICD-10-CM

## 2023-12-22 DIAGNOSIS — I2510 Atherosclerotic heart disease of native coronary artery without angina pectoris: Secondary | ICD-10-CM

## 2023-12-22 DIAGNOSIS — R0602 Shortness of breath: Secondary | ICD-10-CM

## 2023-12-22 NOTE — Patient Instructions (Addendum)
 Medication Instructions:  Your physician recommends that you continue on your current medications as directed. Please refer to the Current Medication list given to you today.  *If you need a refill on your cardiac medications before your next appointment, please call your pharmacy*  Lab Work: NMR/ALT SOON  If you have labs (blood work) drawn today and your tests are completely normal, you will receive your results only by: MyChart Message (if you have MyChart) OR A paper copy in the mail If you have any lab test that is abnormal or we need to change your treatment, we will call you to review the results.  Testing/Procedures: Your physician has requested that you have a carotid duplex. This test is an ultrasound of the carotid arteries in your neck. It looks at blood flow through these arteries that supply the brain with blood. Allow one hour for this exam. There are no restrictions or special instructions.  Follow-Up: At [facility], you and your health needs are our priority.  As part of our continuing mission to provide you with exceptional heart care, our providers are all part of one team.  This team includes your primary Cardiologist (physician) and Advanced Practice Providers or APPs (Physician Assistants and Nurse Practitioners) who all work together to provide you with the care you need, when you need it.  Your next appointment:   12 month(s)  Provider:   DR Wynell Heath   Your physician wants you to follow-up in: 1 year.  You will receive a reminder letter in the mail two months in advance. If you don't receive a letter, please call our office to schedule the follow-up appointment.   We recommend signing up for the patient portal called "MyChart".  Sign up information is provided on this After Visit Summary.  MyChart is used to connect with patients for Virtual Visits (Telemedicine).  Patients are able to view lab/test results, encounter notes, upcoming  appointments, etc.  Non-urgent messages can be sent to your provider as well.   To learn more about what you can do with MyChart, go to [email or web address].

## 2023-12-23 ENCOUNTER — Encounter (HOSPITAL_BASED_OUTPATIENT_CLINIC_OR_DEPARTMENT_OTHER): Payer: Self-pay | Admitting: Nurse Practitioner

## 2023-12-24 DIAGNOSIS — E785 Hyperlipidemia, unspecified: Secondary | ICD-10-CM | POA: Diagnosis not present

## 2023-12-24 DIAGNOSIS — R931 Abnormal findings on diagnostic imaging of heart and coronary circulation: Secondary | ICD-10-CM | POA: Diagnosis not present

## 2023-12-25 LAB — NMR, LIPOPROFILE
Cholesterol, Total: 180 mg/dL (ref 100–199)
HDL Particle Number: 46.8 umol/L (ref >=30.5)
HDL-C: 82 mg/dL (ref >39)
LDL Particle Number: 779 nmol/L (ref <1000)
LDL Size: 21.2 nm (ref >20.5)
LDL-C (NIH Calc): 86 mg/dL (ref 0–99)
LP-IR Score: 34 (ref <=45)
Small LDL Particle Number: <90 nmol/L (ref <=527)
Triglycerides: 63 mg/dL (ref 0–149)

## 2023-12-25 LAB — ALT: ALT: 38 IU/L — ABNORMAL HIGH (ref 0–32)

## 2023-12-27 ENCOUNTER — Encounter (HOSPITAL_BASED_OUTPATIENT_CLINIC_OR_DEPARTMENT_OTHER): Payer: Self-pay

## 2023-12-27 ENCOUNTER — Telehealth: Payer: Self-pay | Admitting: Nurse Practitioner

## 2023-12-27 NOTE — Telephone Encounter (Signed)
Patient returned call for her lab results. °

## 2023-12-28 ENCOUNTER — Telehealth: Payer: Self-pay | Admitting: Pharmacy Technician

## 2023-12-28 ENCOUNTER — Other Ambulatory Visit (HOSPITAL_COMMUNITY): Payer: Self-pay

## 2023-12-28 NOTE — Telephone Encounter (Signed)
 Pharmacy Patient Advocate Encounter  Received notification from [employer] that Prior Authorization for Repatha has been APPROVED from 12/28/23 to 06/25/24. Ran test claim, Copay is $117.50- 3 months. This test claim was processed through [facility]- copay amounts may vary at other pharmacies due to pharmacy/plan contracts, or as the patient moves through the different stages of their insurance plan.   PA #/Case ID/Reference #: [id number]

## 2023-12-28 NOTE — Telephone Encounter (Signed)
 Pharmacy Patient Advocate Encounter   Received notification from Physician's Office that prior authorization for Repatha is required/requested.   Insurance verification completed.   The patient is insured through [facility] [employer] .   Per test claim: PA required; PA submitted to above mentioned insurance via CoverMyMeds Key/confirmation #/EOC [id number] Status is pending

## 2023-12-28 NOTE — Telephone Encounter (Signed)
-----   Message from [facility] Danielle G sent at 12/27/2023  4:27 PM EDT ----- Please start a PA for pt. Either praulent or repatha.  Thanks so much.   Best, Danielle

## 2023-12-29 ENCOUNTER — Other Ambulatory Visit (HOSPITAL_BASED_OUTPATIENT_CLINIC_OR_DEPARTMENT_OTHER): Payer: Self-pay | Admitting: *Deleted

## 2023-12-29 ENCOUNTER — Encounter (HOSPITAL_BASED_OUTPATIENT_CLINIC_OR_DEPARTMENT_OTHER): Payer: Self-pay | Admitting: *Deleted

## 2023-12-29 ENCOUNTER — Other Ambulatory Visit (HOSPITAL_BASED_OUTPATIENT_CLINIC_OR_DEPARTMENT_OTHER): Payer: Self-pay

## 2023-12-29 DIAGNOSIS — R931 Abnormal findings on diagnostic imaging of heart and coronary circulation: Secondary | ICD-10-CM

## 2023-12-29 DIAGNOSIS — E785 Hyperlipidemia, unspecified: Secondary | ICD-10-CM

## 2023-12-29 MED ORDER — REPATHA SURECLICK 140 MG/ML ~~LOC~~ SOAJ
140.0000 mg | SUBCUTANEOUS | 3 refills | Status: DC
  Filled 2023-12-29: qty 6, 84d supply, fill #0

## 2023-12-29 NOTE — Addendum Note (Signed)
 Addended by: Guss Legacy on: 12/29/2023 09:22 AM   Modules accepted: Orders

## 2023-12-29 NOTE — Telephone Encounter (Signed)
 Manuella Seller sent a message through secure chat that this pt needs a fasting NMR in 3 months.  Ordered, released and mailed to pt. Sent pt [employer].

## 2024-01-21 ENCOUNTER — Encounter (HOSPITAL_BASED_OUTPATIENT_CLINIC_OR_DEPARTMENT_OTHER)

## 2024-01-21 ENCOUNTER — Ambulatory Visit (INDEPENDENT_AMBULATORY_CARE_PROVIDER_SITE_OTHER)

## 2024-01-21 DIAGNOSIS — R0989 Other specified symptoms and signs involving the circulatory and respiratory systems: Secondary | ICD-10-CM | POA: Diagnosis not present

## 2024-01-24 ENCOUNTER — Ambulatory Visit (HOSPITAL_BASED_OUTPATIENT_CLINIC_OR_DEPARTMENT_OTHER): Payer: Self-pay | Admitting: Nurse Practitioner

## 2024-01-28 DIAGNOSIS — J069 Acute upper respiratory infection, unspecified: Secondary | ICD-10-CM | POA: Diagnosis not present

## 2024-03-08 ENCOUNTER — Encounter (HOSPITAL_BASED_OUTPATIENT_CLINIC_OR_DEPARTMENT_OTHER): Payer: Self-pay

## 2024-03-08 ENCOUNTER — Telehealth: Payer: Self-pay | Admitting: Nurse Practitioner

## 2024-03-08 MED ORDER — REPATHA SURECLICK 140 MG/ML ~~LOC~~ SOAJ: 140.0000 mg | SUBCUTANEOUS | 3 refills | Status: AC

## 2024-03-08 NOTE — Telephone Encounter (Signed)
*  STAT* If patient is at the pharmacy, call can be transferred to refill team.   1. Which medications need to be refilled? (please list name of each medication and dose if known) changing pharmacy- need a new prescription for Repatha    2. Would you like to learn more about the convenience, safety, & potential cost savings by using the [facility]?     3. Are you open to using the [facility] (Type [facility].  4. Which pharmacy/location (including street and city if local pharmacy) is medication to be sent to? [facility] [address], [place]   5. Do they need a 30 day or 90 day supply?

## 2024-03-22 DIAGNOSIS — I10 Essential (primary) hypertension: Secondary | ICD-10-CM | POA: Diagnosis not present

## 2024-03-22 DIAGNOSIS — Z1331 Encounter for screening for depression: Secondary | ICD-10-CM | POA: Diagnosis not present

## 2024-03-22 DIAGNOSIS — F331 Major depressive disorder, recurrent, moderate: Secondary | ICD-10-CM | POA: Diagnosis not present

## 2024-03-22 DIAGNOSIS — E039 Hypothyroidism, unspecified: Secondary | ICD-10-CM | POA: Diagnosis not present

## 2024-03-22 DIAGNOSIS — Z0000 Encounter for general adult medical examination without abnormal findings: Secondary | ICD-10-CM | POA: Diagnosis not present

## 2024-03-22 DIAGNOSIS — E785 Hyperlipidemia, unspecified: Secondary | ICD-10-CM | POA: Diagnosis not present

## 2024-03-22 DIAGNOSIS — I2510 Atherosclerotic heart disease of native coronary artery without angina pectoris: Secondary | ICD-10-CM | POA: Diagnosis not present

## 2024-03-22 DIAGNOSIS — Z23 Encounter for immunization: Secondary | ICD-10-CM | POA: Diagnosis not present

## 2024-03-22 DIAGNOSIS — K5900 Constipation, unspecified: Secondary | ICD-10-CM | POA: Diagnosis not present

## 2024-03-22 DIAGNOSIS — F419 Anxiety disorder, unspecified: Secondary | ICD-10-CM | POA: Diagnosis not present

## 2024-03-22 DIAGNOSIS — J449 Chronic obstructive pulmonary disease, unspecified: Secondary | ICD-10-CM | POA: Diagnosis not present

## 2024-03-22 DIAGNOSIS — G4733 Obstructive sleep apnea (adult) (pediatric): Secondary | ICD-10-CM | POA: Diagnosis not present

## 2024-03-22 DIAGNOSIS — I700 Atherosclerosis of aorta: Secondary | ICD-10-CM | POA: Diagnosis not present

## 2024-03-22 DIAGNOSIS — G3184 Mild cognitive impairment, so stated: Secondary | ICD-10-CM | POA: Diagnosis not present

## 2024-03-29 ENCOUNTER — Encounter (HOSPITAL_BASED_OUTPATIENT_CLINIC_OR_DEPARTMENT_OTHER): Payer: Self-pay

## 2024-03-31 DIAGNOSIS — E785 Hyperlipidemia, unspecified: Secondary | ICD-10-CM | POA: Diagnosis not present

## 2024-03-31 DIAGNOSIS — R931 Abnormal findings on diagnostic imaging of heart and coronary circulation: Secondary | ICD-10-CM | POA: Diagnosis not present

## 2024-04-01 LAB — NMR, LIPOPROFILE
Cholesterol, Total: 125 mg/dL (ref 100–199)
HDL Particle Number: 46.2 umol/L (ref >=30.5)
HDL-C: 85 mg/dL (ref >39)
LDL Particle Number: <300 nmol/L (ref <1000)
LDL-C (NIH Calc): 28 mg/dL (ref 0–99)
LP-IR Score: 47 — ABNORMAL HIGH (ref <=45)
Small LDL Particle Number: <90 nmol/L (ref <=527)
Triglycerides: 55 mg/dL (ref 0–149)

## 2024-04-03 ENCOUNTER — Ambulatory Visit: Payer: Self-pay | Admitting: Nurse Practitioner

## 2024-05-05 DIAGNOSIS — K5900 Constipation, unspecified: Secondary | ICD-10-CM | POA: Diagnosis not present

## 2024-06-26 DIAGNOSIS — H04123 Dry eye syndrome of bilateral lacrimal glands: Secondary | ICD-10-CM | POA: Diagnosis not present

## 2024-06-26 DIAGNOSIS — H524 Presbyopia: Secondary | ICD-10-CM | POA: Diagnosis not present

## 2024-09-14 ENCOUNTER — Other Ambulatory Visit: Payer: Self-pay | Admitting: Nurse Practitioner
